# Patient Record
Sex: Male | Born: 1975 | Race: White | Hispanic: No | Marital: Single | Smoking: Former smoker
Health system: Southern US, Academic
[De-identification: ages and names within clinical notes are randomized; demographics above are authoritative.]

## PROBLEM LIST (undated history)

## (undated) DIAGNOSIS — E119 Type 2 diabetes mellitus without complications: Secondary | ICD-10-CM

## (undated) DIAGNOSIS — E782 Mixed hyperlipidemia: Secondary | ICD-10-CM

## (undated) DIAGNOSIS — K7460 Unspecified cirrhosis of liver: Secondary | ICD-10-CM

## (undated) DIAGNOSIS — I10 Essential (primary) hypertension: Secondary | ICD-10-CM

## (undated) HISTORY — DX: Unspecified cirrhosis of liver: K74.60

## (undated) HISTORY — PX: HX HERNIA REPAIR: SHX51

## (undated) HISTORY — DX: Mixed hyperlipidemia: E78.2

## (undated) HISTORY — PX: ABSCESS DRAINAGE: SHX399

## (undated) HISTORY — DX: Type 2 diabetes mellitus without complications: E11.9

## (undated) HISTORY — DX: Essential (primary) hypertension: I10

---

## 2018-10-02 ENCOUNTER — Ambulatory Visit: Payer: Medicaid Other | Attending: Hand Surgery | Admitting: Hand Surgery

## 2018-10-02 ENCOUNTER — Other Ambulatory Visit (INDEPENDENT_AMBULATORY_CARE_PROVIDER_SITE_OTHER): Payer: Self-pay | Admitting: Hand Surgery

## 2018-10-02 ENCOUNTER — Encounter (INDEPENDENT_AMBULATORY_CARE_PROVIDER_SITE_OTHER): Payer: Self-pay | Admitting: Hand Surgery

## 2018-10-02 DIAGNOSIS — Z6841 Body Mass Index (BMI) 40.0 and over, adult: Secondary | ICD-10-CM | POA: Insufficient documentation

## 2018-10-02 DIAGNOSIS — Z87891 Personal history of nicotine dependence: Secondary | ICD-10-CM | POA: Insufficient documentation

## 2018-10-02 DIAGNOSIS — L987 Excessive and redundant skin and subcutaneous tissue: Secondary | ICD-10-CM

## 2018-10-02 NOTE — Progress Notes (Signed)
Tony Dillon  [id number]  09/26/1975  10/02/2018          HISTORY AND PHYSICAL EXAM      REFERRING PROVIDER:  Edwina Barth, MD  [address]  [place], [place] [address]        Chief Complaint   Patient presents with   . Other     consult panniculectomy, excess skin              HISTORY OF PRESENT ILLNESS:      Tony Dillon is a 43 y.o. male who [facility] accompanied by his spouse for evaluation of "removing excess and hanging abdominal fat and skin".  The patient states "he has had abscesses".  The patient is having ongoing problems with  recurrent skin rashes, infections,  cellutitis, chaffing and/or non-healing ulcers with 4 courses of antibiotics and surgical drainage of the abscess in may of this year.  Prescription medications and other conventional strategies have failed to manage these symptoms. The patient admits to the excess weight of the pannus causing problems with walking and their ability to perform day to day activities and household chores. The patient's current BMI is 42.77.  The patient admits that he has had no weight loss.         MEDICAL HISTORY:      No past medical history on file.  Past Medical History was reviewed and is negative for diabetes according to the patient.  HTN is poorly controlled.      Past Surgical History was reviewed and is negative for any procedures other than the I&D of the abscess.        Social History     Socioeconomic History   . Marital status: Single     Spouse name: Not on file   . Number of children: Not on file   . Years of education: Not on file   . Highest education level: Not on file   Occupational History   . Not on file   Social Needs   . Financial resource strain: Not on file   . Food insecurity     Worry: Not on file     Inability: Not on file   . Transportation needs     Medical: Not on file     Non-medical: Not on file   Tobacco Use   . Smoking status: Former Games developer   . Smokeless tobacco: Never Used   Substance and Sexual  Activity   . Alcohol use: Not on file   . Drug use: Not on file   . Sexual activity: Not on file   Lifestyle   . Physical activity     Days per week: Not on file     Minutes per session: Not on file   . Stress: Not on file   Relationships   . Social Wellsite geologist on phone: Not on file     Gets together: Not on file     Attends religious service: Not on file     Active member of club or organization: Not on file     Attends meetings of clubs or organizations: Not on file     Relationship status: Not on file   . Intimate partner violence     Fear of current or ex partner: Not on file     Emotionally abused: Not on file     Physically abused: Not on file  Forced sexual activity: Not on file   Other Topics Concern   . Not on file   Social History Narrative   . Not on file       Social History     Social History Narrative   . Not on file       Social History     Substance and Sexual Activity   Drug Use Not on file       Family Medical History:     None              OB History   No obstetric history on file.         Current Outpatient Medications   Medication Sig   . Ibuprofen (MOTRIN) 200 mg Oral Tablet Take 200 mg by mouth Four times a day as needed for Pain   . lisinopriL (PRINIVIL) 20 mg Oral Tablet Take 20 mg by mouth Once a day   . omeprazole (PRILOSEC) 40 mg Oral Capsule, Delayed Release(E.C.) Take 40 mg by mouth Once a day       Allergies   Allergen Reactions   . Adhesive Rash and Itching     Bandages          REVIEW OF SYSTEMS:    + as per HPI. Denies any headache, dizziness, nasal congestion, sore throat, chest pain, dyspnea, nausea, vomiting, abdomnial pain, diarrhea, constipation, seizures, and ataxia. All other systems were reviewed and are negative.             PHYSICAL EXAMINATION:      Most Recent Vitals      Office Visit from 10/02/2018 in [facility] Phys[facility] Office   Ctr   Temperature  36.5 C (97.7 F) filed at... 10/02/2018 1314   Heart Rate  (!) 114 filed at... 10/02/2018 1314    Respiratory Rate  --   BP (Non-Invasive)  (!) 175/90 filed at... 10/02/2018 1314   SpO2  --   Height  1.943 m (6' 4.5") filed at... 10/02/2018 1314   Weight  (!) 162 kg (356 lb 0.7 oz) filed at... 10/02/2018 1314   BMI (Calculated)  42.86 filed at... 10/02/2018 1314   BSA (Calculated)  2.95 filed at... 10/02/2018 1314        GS: WDWN , NAD, nonseptic   HENT: Normocephalic/atraumatic, sclera anicteric , mucosa pink without oral lesions, throat non erythematous   Neck: Supple, no thyromegaly, nl ROM   Pulmonary: CTA bilaterally, no w/r/r  Cardiac: RRR, no m/r/g  Lymph: no axillary, cervical or supraclavicular adenopathy bilaterally   Abdomen:  Non-tender. No organomegaly. Excess abdominal skin present with an apron.   Neuro: A&Ox3, CN II-XII grossly intact   Skin: Non-diaphoretic.   Psychiatric: Normal mood, affect and behavior.             IMAGING DATA:    NONE.         LABORATORY DATA:    NONE.      PATHOLOGY:  NONE.           ASSESSMENT:    Morbid obesity    PLAN:    1. The patient is not an appropriate candidate for a panniculectomy at this time  2. We did explain the surgical procedure and incision lines that are used and scar afterwards. We discussed the benefits, alternatives and risks with the patient at length.   Risks discussed included but were not limited to:  general anesthesia, sudden death, MI, CVA,  infection, bleeding,  scarring, hematoma, seroma, pain/soreness, abdominal asymmetry, skin necrosis, wound dehiscense, delayed wound healing, venous thrombosis, injury to surrounding organs/structures, and need for additional procedures.  3. Referred to bariatric surgery.   4. Discussed that it's best to perform the surgery once patient is at ideal body weight to also maximize aesthetic results and have best chance for success. Once he is at ideal body weight and has maintained it for 3 months, patient should call for a return appointment to discuss having the surgery.      Approximately 25 of 45 minutes  (more than 50% of the visit) was spent interviewing, examining and counseling the patient about the plan and recommendations.  The other 20 minutes was spent reviewing the medical records.    Meredeth IdeJack Marietta Sikkema, MD  10/02/2018, 13:43   Department of Reconstructive, Plastic and Hand Surgery

## 2020-03-20 IMAGING — MR MRI LUMBAR SPINE WITHOUT CONTRAST
7 series · 48 of 48 positions shown · IV contrast (gadolinium)
Comparison: None available.

﻿EXAM:  [id number]   MRI LUMBAR SPINE WITHOUT CONTRAST
INDICATION: Lumbar disc degeneration.
TECHNIQUE: Multiplanar multisequential MRI of the lumbar spine was performed without gadolinium contrast.

[Series 4: T2 · sagittal · 5.0mm · 1.00mm/px · 5 of 15 slices shown (1 of 4)]
[im 1/15]
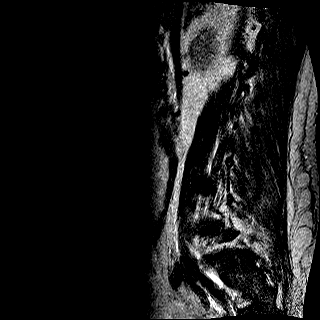
[im 4/15]
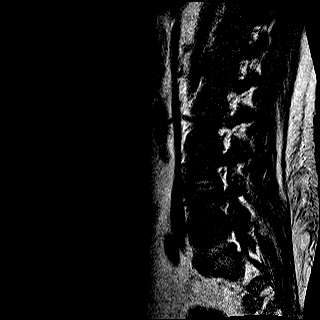
[im 8/15]
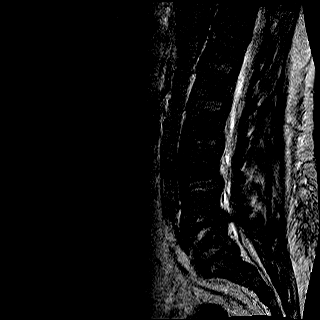
[im 11/15]
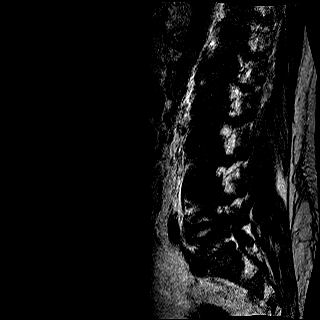
[im 15/15]
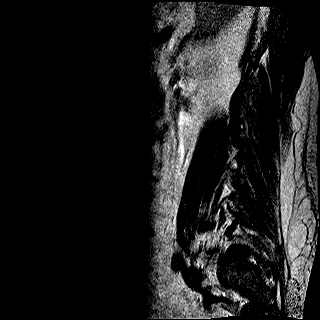

[Series 5: T1 · sagittal · 5.0mm · 1.00mm/px · 6 of 15 slices shown (1 of 2)]
[im 1/15]
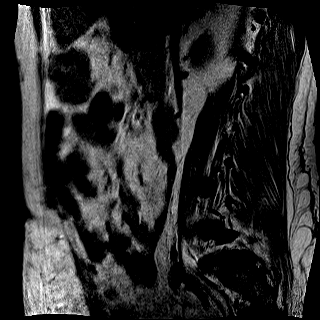
[im 3/15]
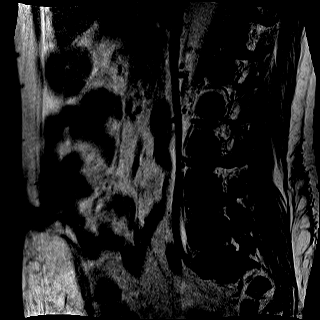
[im 6/15]
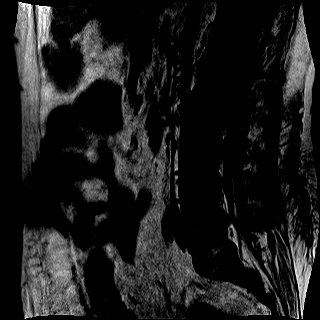
[im 9/15]
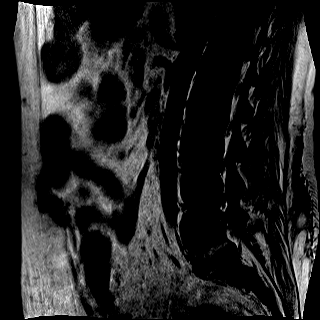
[im 12/15]
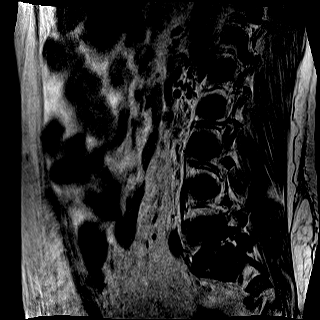
[im 15/15]
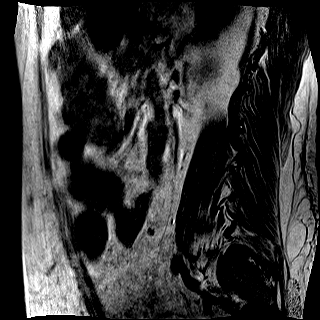

[Series 6: STIR · sagittal · 5.0mm · 1.25mm/px · 6 of 15 slices shown]
[im 1/15]
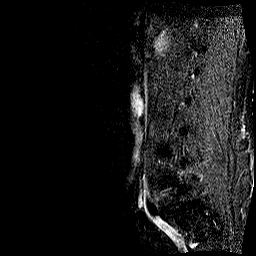
[im 3/15]
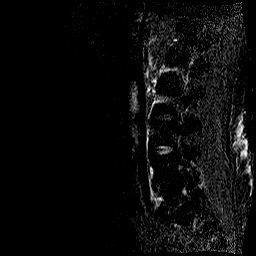
[im 6/15]
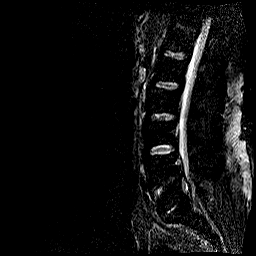
[im 9/15]
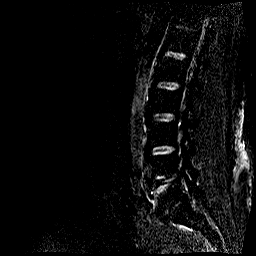
[im 12/15]
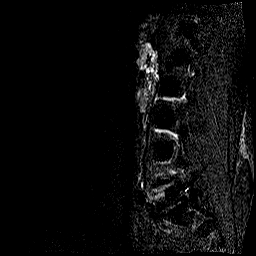
[im 15/15]
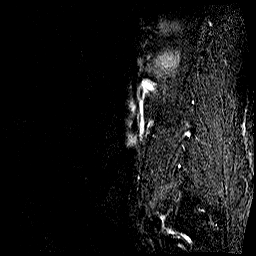

[Series 7: T2 · axial · 5.0mm · 0.89mm/px · z∈[-135,+76]mm · 9 of 25 slices shown (2 of 4)]
[im 1/25]
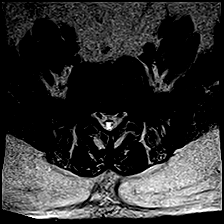
[im 4/25]
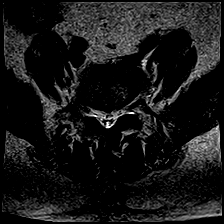
[im 7/25]
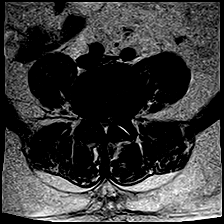
[im 10/25]
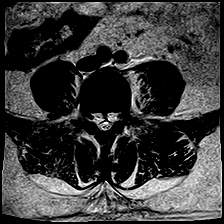
[im 13/25]
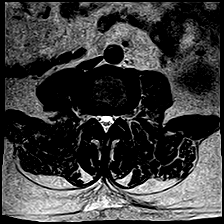
[im 16/25]
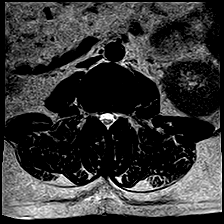
[im 19/25]
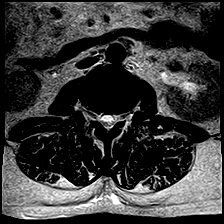
[im 22/25]
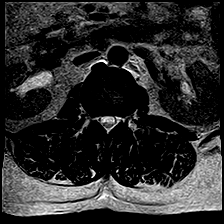
[im 25/25]
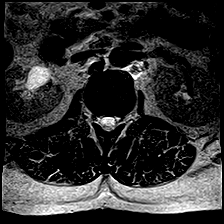

[Series 8: T1 · axial · 5.0mm · 0.89mm/px · z∈[-135,+76]mm · 9 of 25 slices shown (2 of 2)]
[im 1/25]
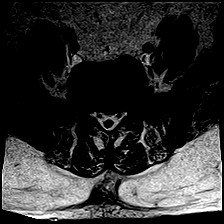
[im 4/25]
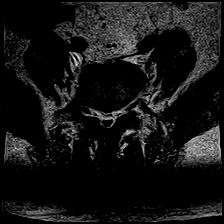
[im 7/25]
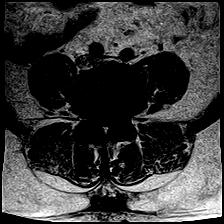
[im 10/25]
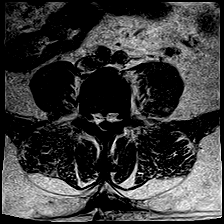
[im 13/25]
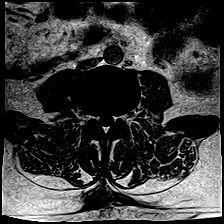
[im 16/25]
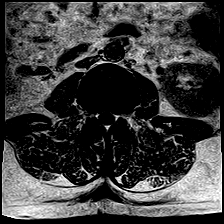
[im 19/25]
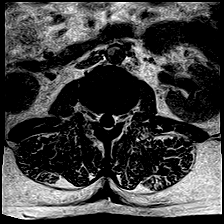
[im 22/25]
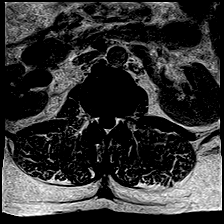
[im 25/25]
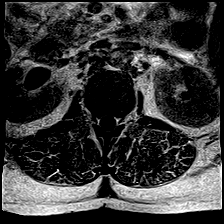

[Series 9: T2 · coronal · 4.0mm · 1.34mm/px · 7 of 20 slices shown (3 of 4)]
[im 1/20]
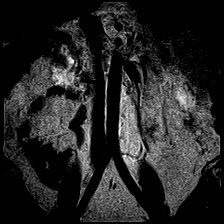
[im 4/20]
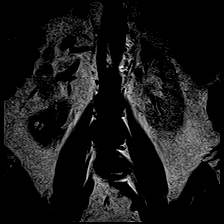
[im 7/20]
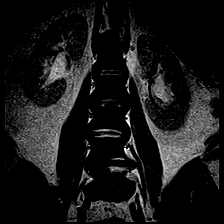
[im 10/20]
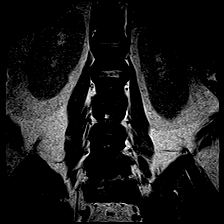
[im 13/20]
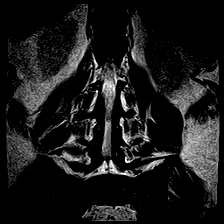
[im 16/20]
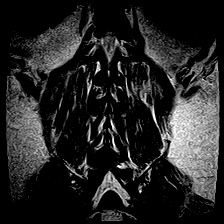
[im 20/20]
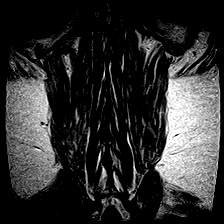

[Series 10: T2 · sagittal · 5.0mm · 1.00mm/px · 6 of 15 slices shown (4 of 4)]
[im 1/15]
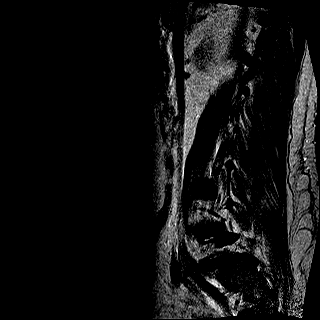
[im 3/15]
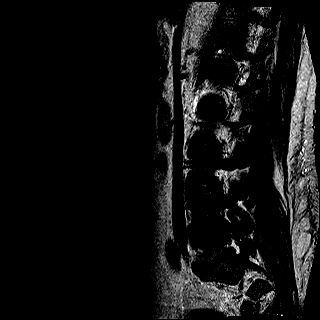
[im 6/15]
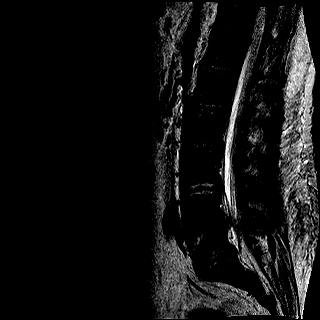
[im 9/15]
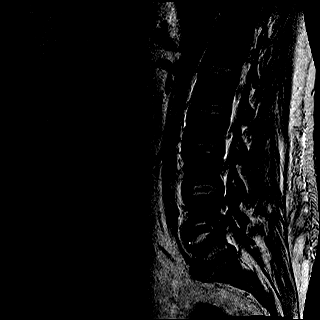
[im 12/15]
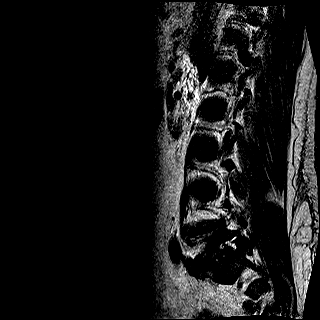
[im 15/15]
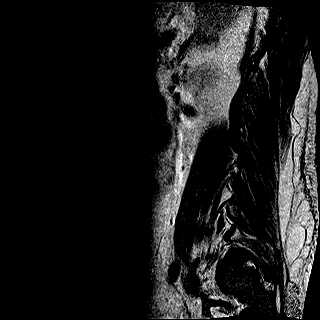

[48 of 48 positions shown; findings below may reference images not displayed]

FINDINGS: This examination is limited by the patient’s large size which required use of the body coil, and therefore imaging quality is suboptimal.  

At L4-5 there is a moderate sized disc herniation that is contributing to severe spinal stenosis at this level.  

There are moderate degenerative changes at L4-5 and L5-S1.  

The remaining visualized levels appear unremarkable.  There is no fracture, malalignment, or abnormality of the conus.  Degenerative marrow signal alteration is noted at L4-5.
IMPRESSION: 1. L4-5 disc herniation with severe spinal stenosis.  

:

## 2020-05-13 IMAGING — MR MRI CERVICAL SPINE WITHOUT CONTRAST
4 of 5 series · 23 of 48 positions shown · IV contrast (gadolinium)
Comparison: None available.

﻿EXAM:  [id number]   MRI CERVICAL SPINE WITHOUT CONTRAST
INDICATION: Neck pain with bilateral upper extremity radiculopathy.
TECHNIQUE: Multiplanar multisequential MRI of the cervical spine was performed without gadolinium contrast.

[Series 5: T2 · sagittal · 3.0mm · 0.75mm/px · 8 of 15 slices shown (1 of 2)]
[im 1/15]
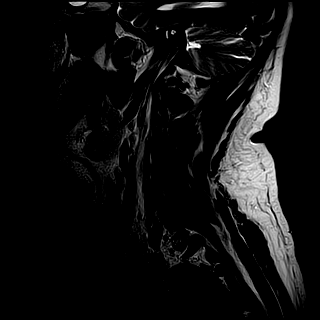
[im 3/15]
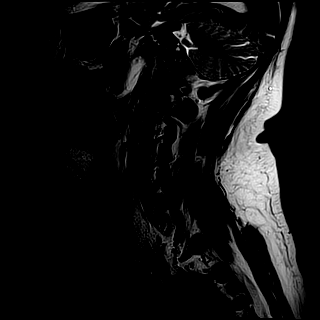
[im 5/15]
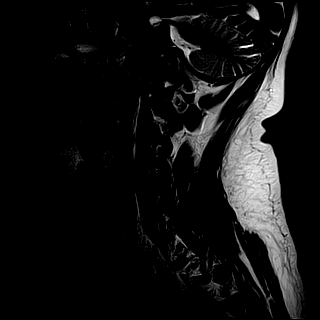
[im 7/15]
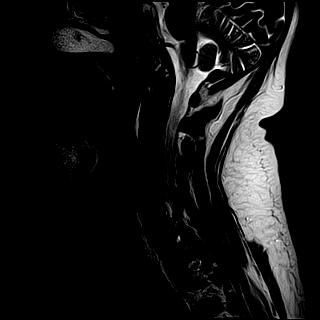
[im 9/15]
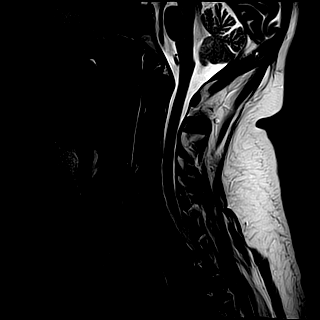
[im 11/15]
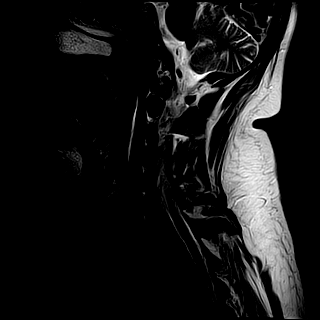
[im 13/15]
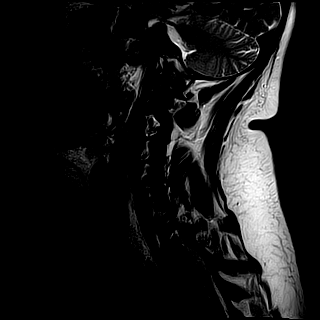
[im 15/15]
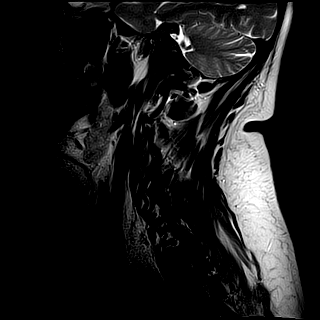

[Series 6: T1 · sagittal · 3.0mm · 0.47mm/px · 3 of 15 slices shown]
[im 2/15]
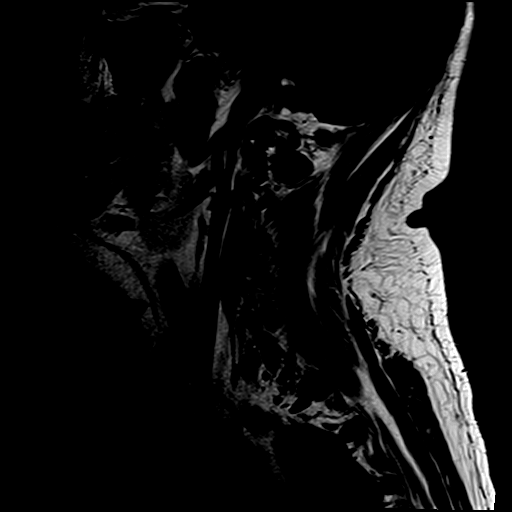
[im 8/15]
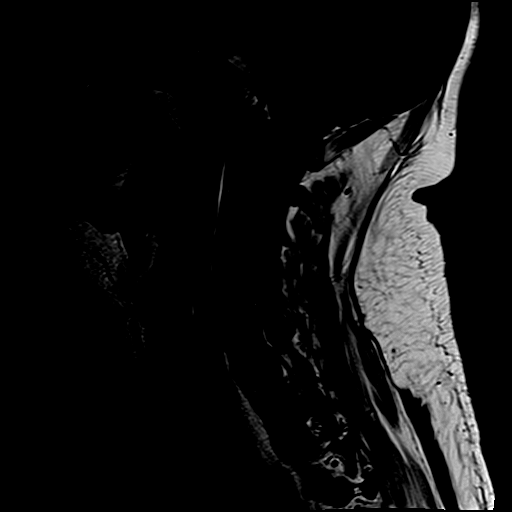
[im 13/15]
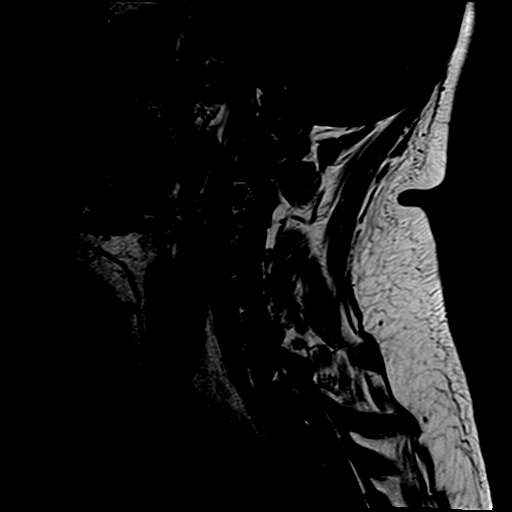

[Series 7: STIR · sagittal · 3.0mm · 0.47mm/px · 3 of 15 slices shown]
[im 2/15]
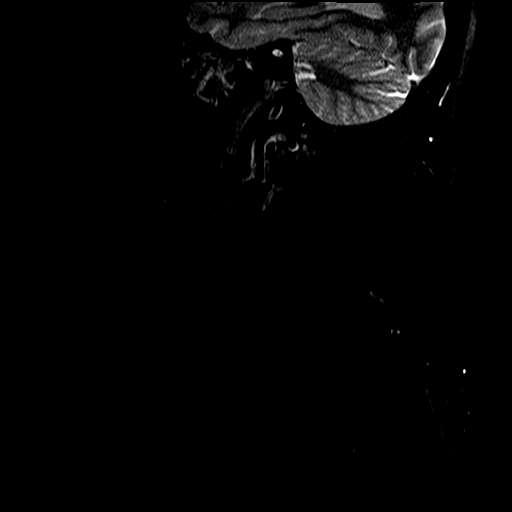
[im 8/15]
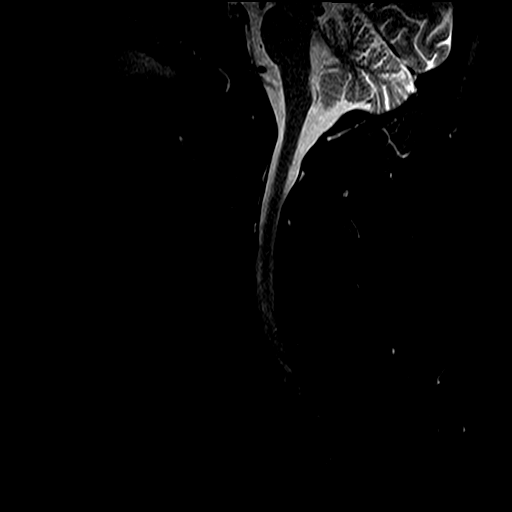
[im 13/15]
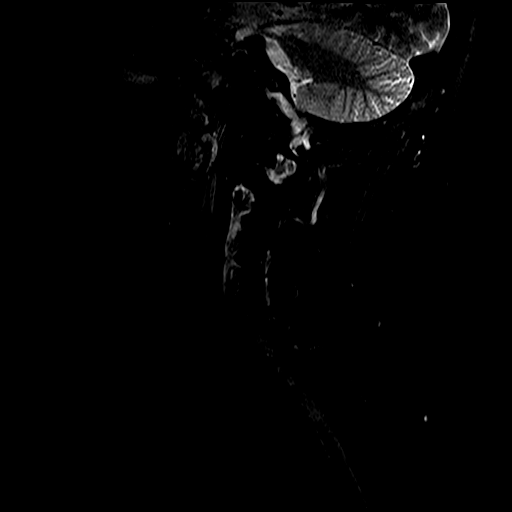

[Series 8: T2 · axial · 3.0mm · 0.39mm/px · z∈[-106,+4]mm · 9 of 18 slices shown (2 of 2)]
[im 1/18]
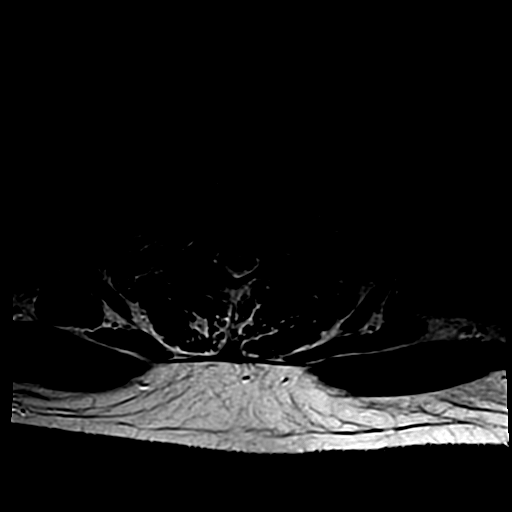
[im 2/18]
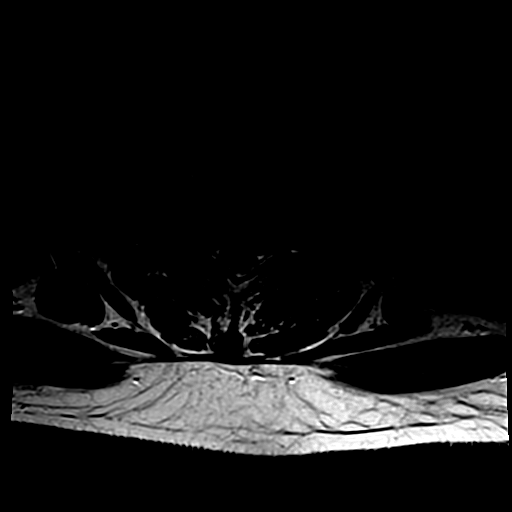
[im 4/18]
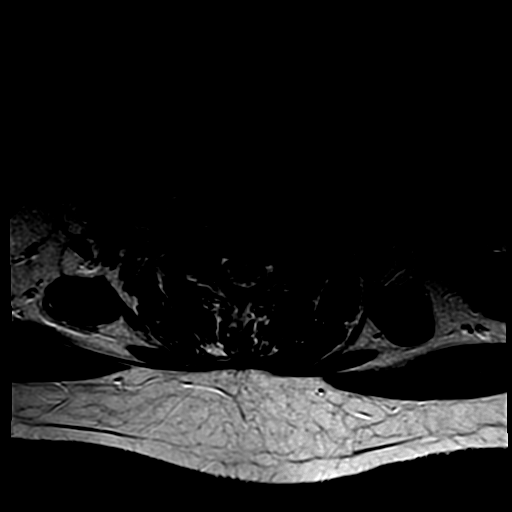
[im 6/18]
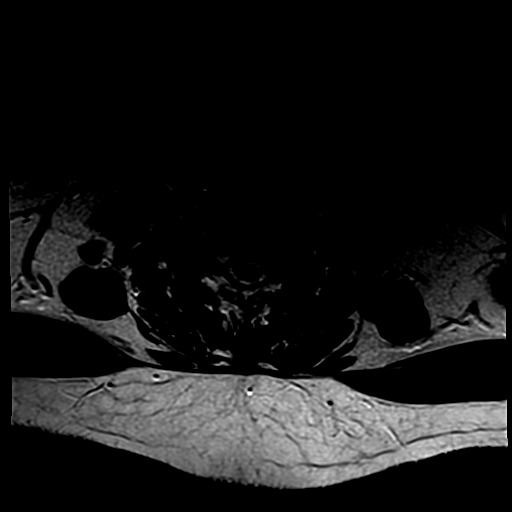
[im 7/18]
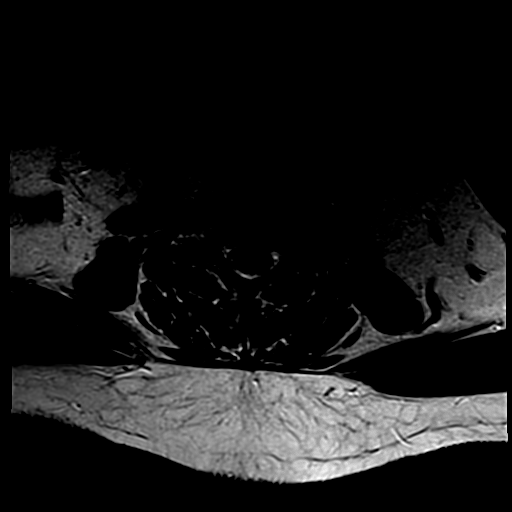
[im 9/18]
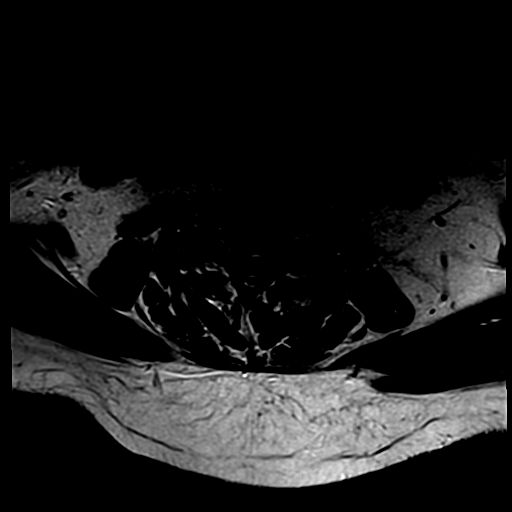
[im 11/18]
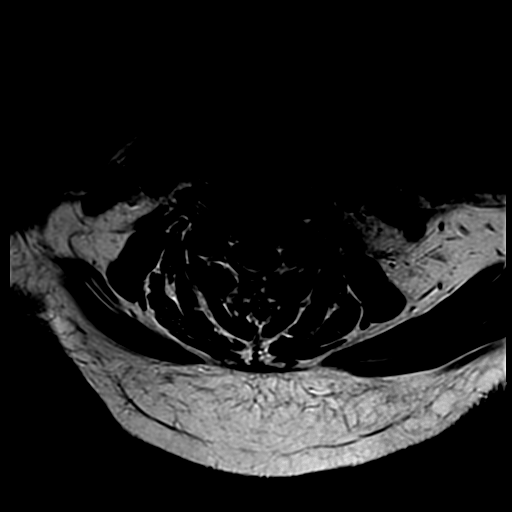
[im 12/18]
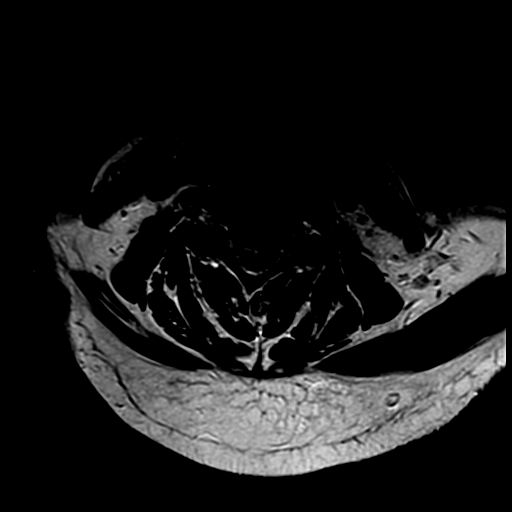
[im 16/18]
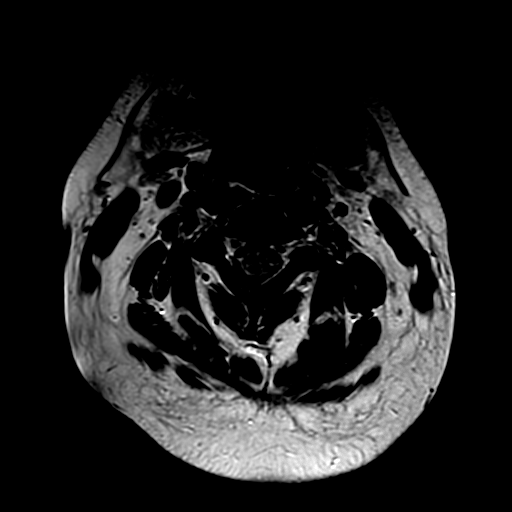

[23 of 48 positions shown; findings below may reference images not displayed]

FINDINGS: There is heterogeneous low T1 signal intensity within the visualized cervical and thoracic vertebral bodies. No acute fracture or subluxation is seen. Visualized spinal cord is normal in signal intensity without evidence of compression at any level.

C2-3 level is unremarkable.

At C3-4 level, there is a minimal bulging annulus, minimally effacing the ventral CSF. There is no significant neural foraminal stenosis.

At C4-5 level, there is mild left neural foraminal stenosis from uncovertebral joint hypertrophy.

At C5-6 level, there is moderate to severe left neural foraminal stenosis from facet and uncovertebral joint hypertrophy.

C6-7, C7-T1 level and paraspinal soft tissues are unremarkable.
IMPRESSION: 1. Heterogeneous low T1 signal intensity within the visualized cervical and thoracic vertebral bodies. Similar findings can be seen in patients with chronic anemia resulting in fatty marrow reconversion to red marrow. Correlation with patient's hematocrit level is recommended. 

2. No significant disc herniation or spinal stenosis at any level. 

3. Multilevel neural foraminal stenosis as detailed above.

## 2021-03-14 IMAGING — MR MRI LUMBAR SPINE WITHOUT CONTRAST
6 series · 48 of 48 positions shown · non-contrast
Comparison: MRI lumbosacral spine dated 03/20/2020.  MRI cervical spine dated 05/13/2020.

﻿EXAM:  [id number]   MRI LUMBAR SPINE WITHOUT CONTRAST
INDICATION: 46-year-old with persistent low back pain.  No history of malignancy or back surgery.  Patient reports history of liver problem and anemia.
TECHNIQUE: Coronal, sagittal and axial images of lumbar spine including T1, T2 and STIR sequences.

[Series 4: T2 · sagittal · 5.0mm · 1.00mm/px · 5 of 13 slices shown (1 of 3)]
[im 1/13]
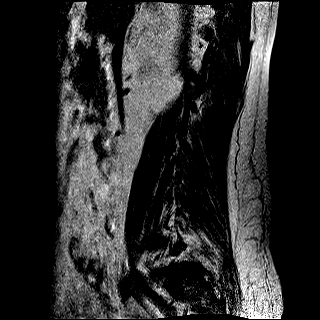
[im 4/13]
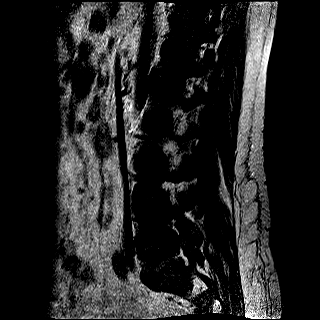
[im 7/13]
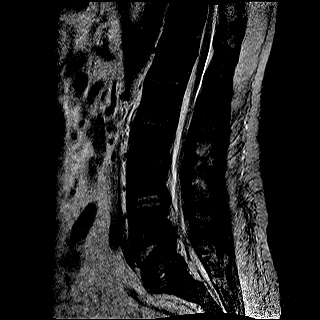
[im 10/13]
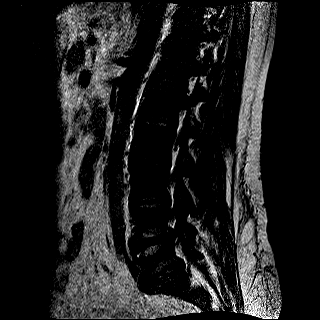
[im 13/13]
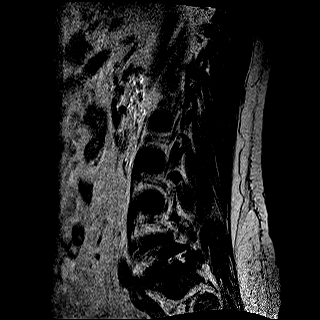

[Series 5: T1 · sagittal · 5.0mm · 1.00mm/px · 6 of 13 slices shown (1 of 2)]
[im 1/13]
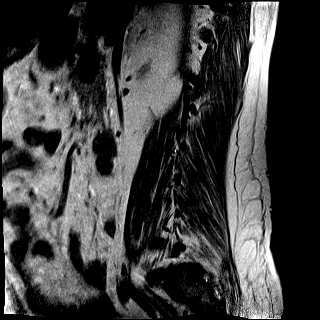
[im 3/13]
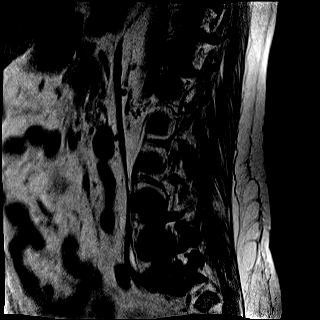
[im 5/13]
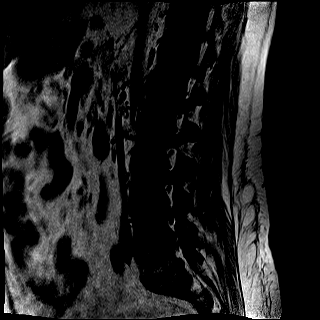
[im 8/13]
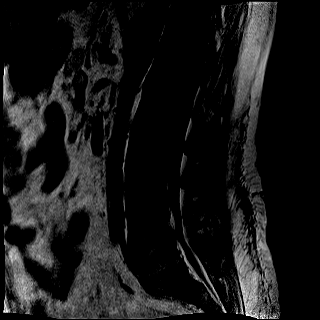
[im 10/13]
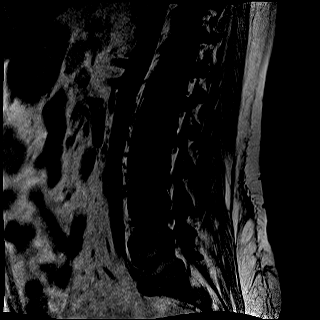
[im 13/13]
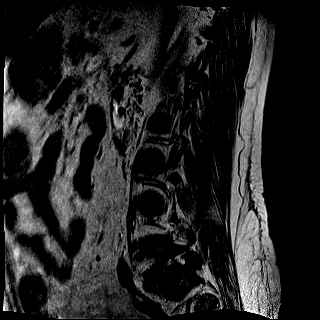

[Series 6: STIR · sagittal · 5.0mm · 1.25mm/px · 6 of 13 slices shown]
[im 1/13]
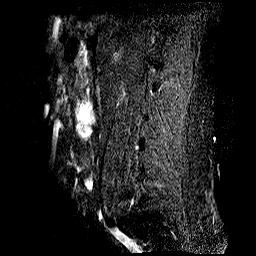
[im 3/13]
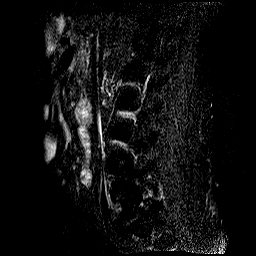
[im 5/13]
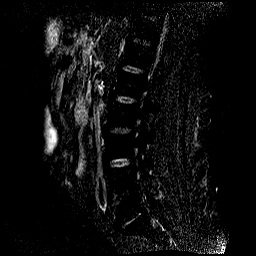
[im 8/13]
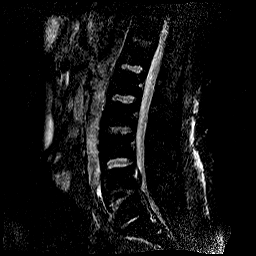
[im 10/13]
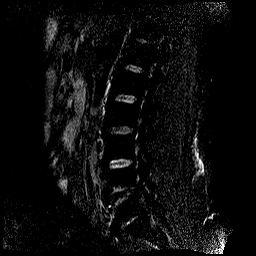
[im 13/13]
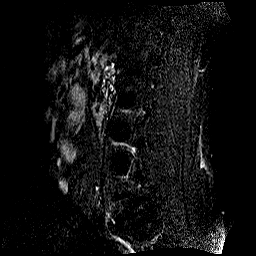

[Series 7: T2 · axial · 5.0mm · 0.89mm/px · z∈[-172,+57]mm · 11 of 24 slices shown (2 of 3)]
[im 1/24]
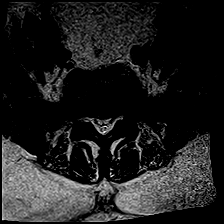
[im 3/24]
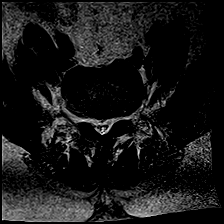
[im 5/24]
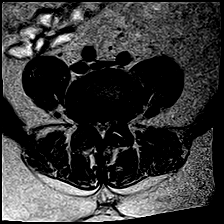
[im 7/24]
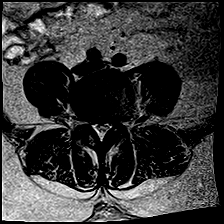
[im 10/24]
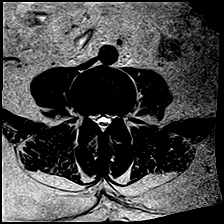
[im 12/24]
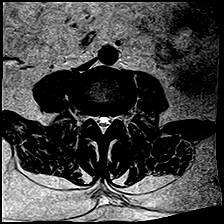
[im 14/24]
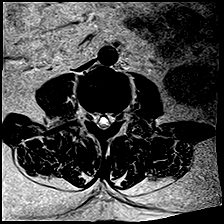
[im 17/24]
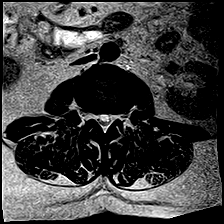
[im 19/24]
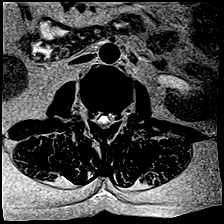
[im 21/24]
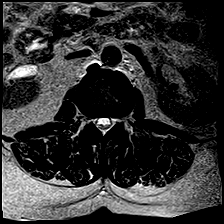
[im 24/24]
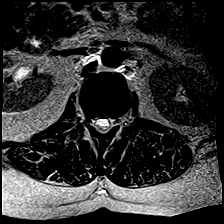

[Series 8: T1 · axial · 5.0mm · 0.89mm/px · z∈[-172,+57]mm · 11 of 24 slices shown (2 of 2)]
[im 1/24]
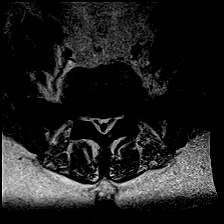
[im 3/24]
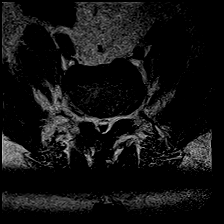
[im 5/24]
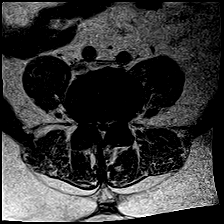
[im 7/24]
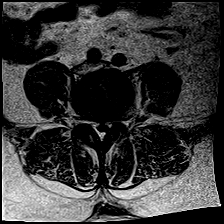
[im 10/24]
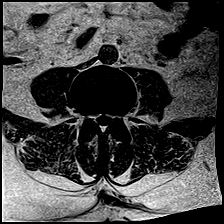
[im 12/24]
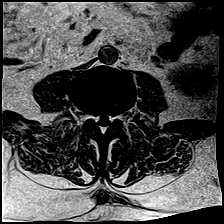
[im 14/24]
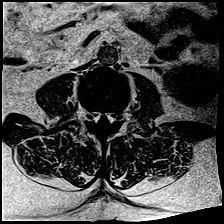
[im 17/24]
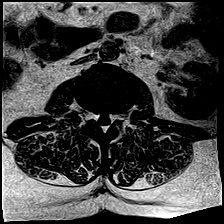
[im 19/24]
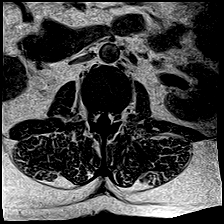
[im 21/24]
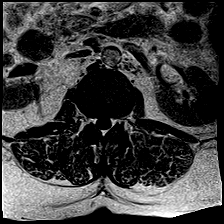
[im 24/24]
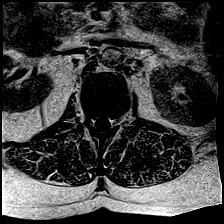

[Series 9: T2 · coronal · 4.0mm · 1.34mm/px · 9 of 20 slices shown (3 of 3)]
[im 1/20]
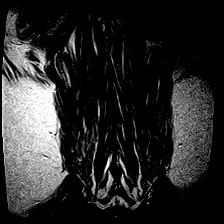
[im 3/20]
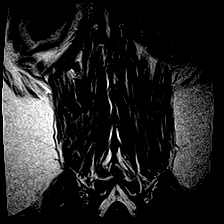
[im 5/20]
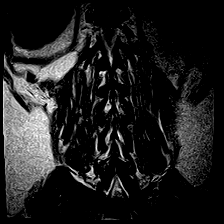
[im 8/20]
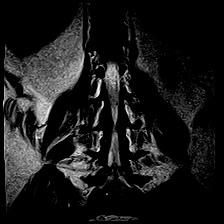
[im 10/20]
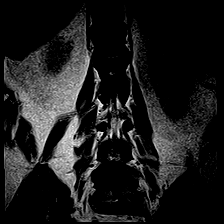
[im 12/20]
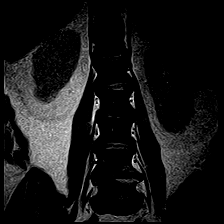
[im 15/20]
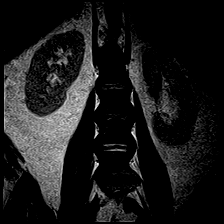
[im 17/20]
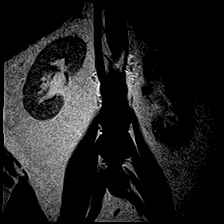
[im 20/20]
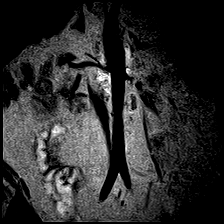

[48 of 48 positions shown; findings below may reference images not displayed]

FINDINGS: No acute bony lesions are seen.  However, significantly decreased T2 signal of bone marrow throughout the lumbar spine is noted similar in appearance to MRI examination of 03/20/2020 as well as a MRI cervical spine findings on 05/13/2020.  

Lower spinal cord and cauda equina are normal.  At L1-L2 level, no focal disc pathology.  Mild bilateral facet arthropathy is noted causing minimal compromise of lateral recesses at L1-L2 level. 

At L2-L3 level, mild bilateral facet arthropathy causing minimal compromise of both lateral recesses similar to prior study.  

At L3-L4 level, bulging annulus and facet arthropathy are causing minimal compromise of lateral recesses similar to prior study.  

At L4-L5 level, severe degenerative disc disease is noted with bulging annulus, asymmetrically to the left and severe facet arthropathy, left worse than the right, causing severe compromise of left lateral recess and left neural foramen.  Compromise of thecal sac is noted with AP diameter in the midline measuring 5.7 mm.  Findings at L4-L5 level are essentially unchanged from previous examination of 03/20/2020.  

At L5-S1 level, mild bilateral foraminal narrowing due to degenerative disc disease and facet arthropathy.  

Paravertebral soft tissues show evidence of hepatomegaly and splenomegaly with craniocaudal span of liver measuring 16.5 cm and spleen 17.5 cm.
IMPRESSION: 1. Significantly and diffusely diminished T2 and T1 bone marrow signal throughout the lumbar spine similar to prior imaging studies of cervical spine and lumbar spine dated 05/13/2020 as well as 03/20/2020.  These findings along with findings of borderline hepatomegaly and significant splenomegaly are suggestive of bone marrow changes secondary to significant chronic anemia and possible hemochromatosis.  

2. At L4-L5 level, severe degenerative disc disease is noted with bulging annulus, asymmetrically to the left and severe facet arthropathy, left worse than the right, causing severe compromise of left lateral recess and left neural foramen.  Compromise of thecal sac is noted with AP diameter in the midline measuring 5.7 mm.  Findings at L4-L5 level are essentially unchanged from previous examination of 03/20/2020.  

3. Findings at other levels are described above in detail.  Overall findings of lumbar discs are unchanged from prior study of 03/20/2020.

## 2021-04-17 ENCOUNTER — Ambulatory Visit (HOSPITAL_COMMUNITY): Payer: Self-pay | Admitting: INTERNAL MEDICINE-ENDOCRINOLOGY-DIABETES AND METABOLISM

## 2021-11-24 IMAGING — MR MRI BRAIN WITHOUT AND WITH CONTRAST
10 of 12 series · 37 of 48 positions shown · IV contrast (gadavist)
Comparison: No prior imaging studies of the brain are available for comparison.

﻿EXAM:  MRI BRAIN WITHOUT AND WITH CONTRAST, ATTENTION PITUITARY GLAND:
INDICATION: 46-year-old male with hyperprolactinemia.  History of hypertension.
TECHNIQUE: Multiplanar, multisequential MRI of the brain was performed without and with 10 mL of Gadavist.  Thin section high-resolution images of the pituitary gland were performed pre and postcontrast in coronal and sagittal dimensions.

[Series 5: DWI · axial · 5.0mm · 1.35mm/px · z∈[-79,+59]mm · 9 of 96 slices shown (1 of 3)]
[im 1/96]
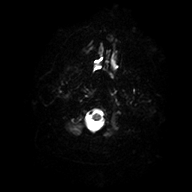
[im 16/96]
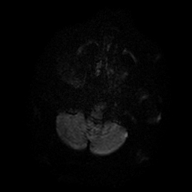
[im 32/96]
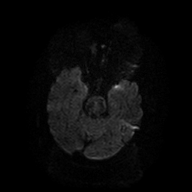
[im 40/96]
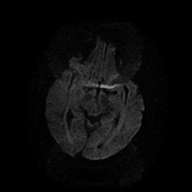
[im 48/96]
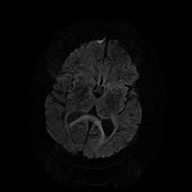
[im 56/96]
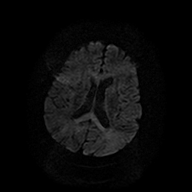
[im 64/96]
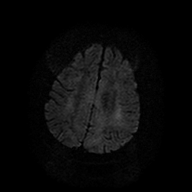
[im 80/96]
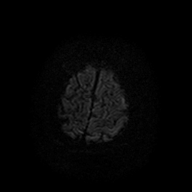
[im 96/96]
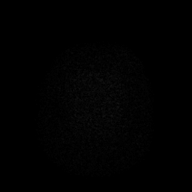

[Series 6: DWI · axial · 5.0mm · 1.35mm/px · z∈[-79,+59]mm · 4 of 24 slices shown (2 of 3)]
[im 1/24]
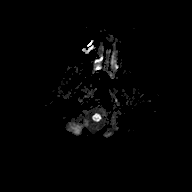
[im 8/24]
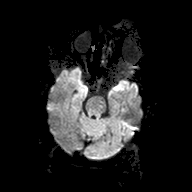
[im 16/24]
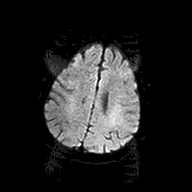
[im 24/24]
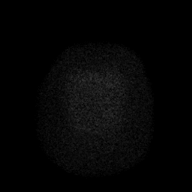

[Series 7: DWI · axial · 5.0mm · 1.35mm/px · z∈[-79,+59]mm · 3 of 23 slices shown (3 of 3)]
[im 1/23]
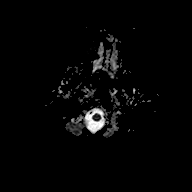
[im 12/23]
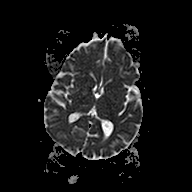
[im 23/23]
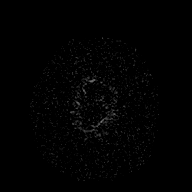

[Series 8: FLAIR · sagittal · 4.0mm · 0.75mm/px · 4 of 30 slices shown (1 of 2)]
[im 1/30]
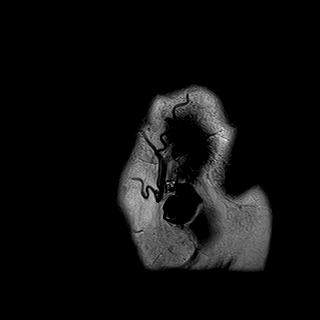
[im 10/30]
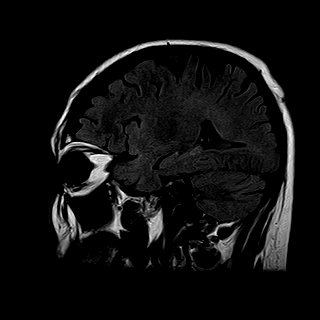
[im 20/30]
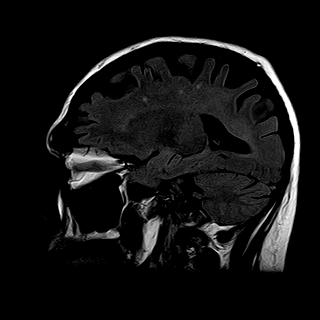
[im 30/30]
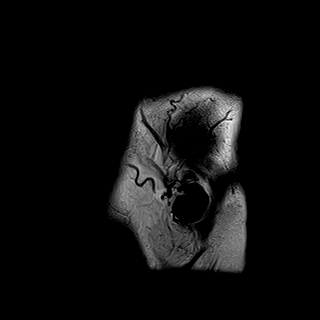

[Series 9: T2 · axial · 5.0mm · 0.43mm/px · z∈[-83,+60]mm · 4 of 25 slices shown]
[im 1/25]
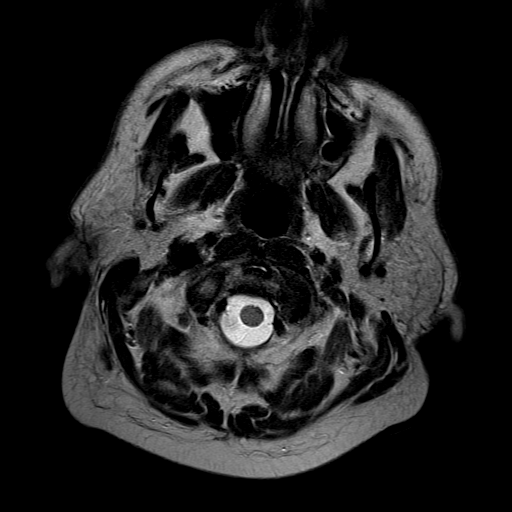
[im 9/25]
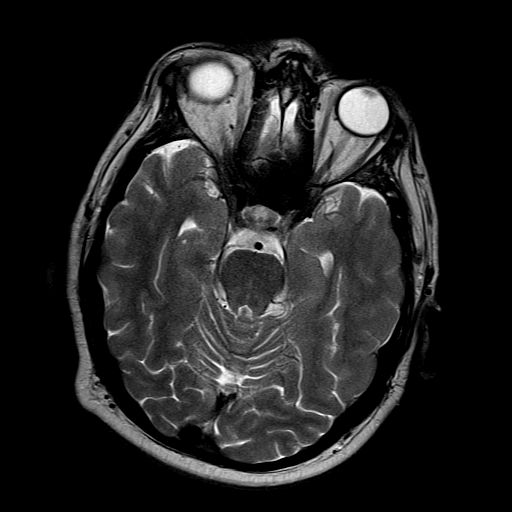
[im 17/25]
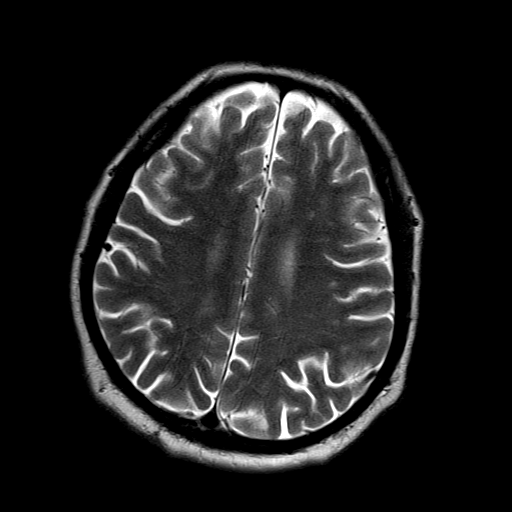
[im 25/25]
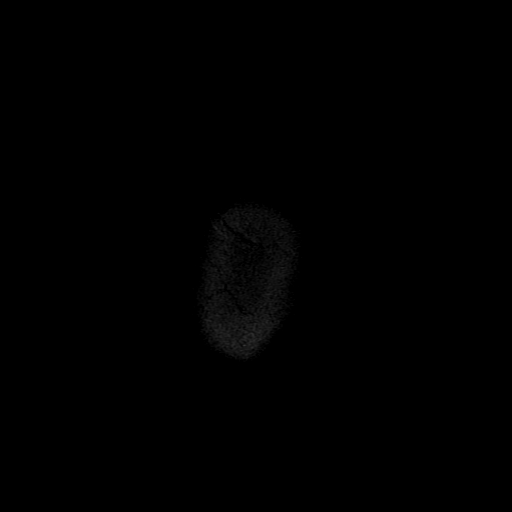

[Series 10: FLAIR · axial · 5.0mm · 0.43mm/px · z∈[-83,+60]mm · 4 of 25 slices shown (2 of 2)]
[im 1/25]
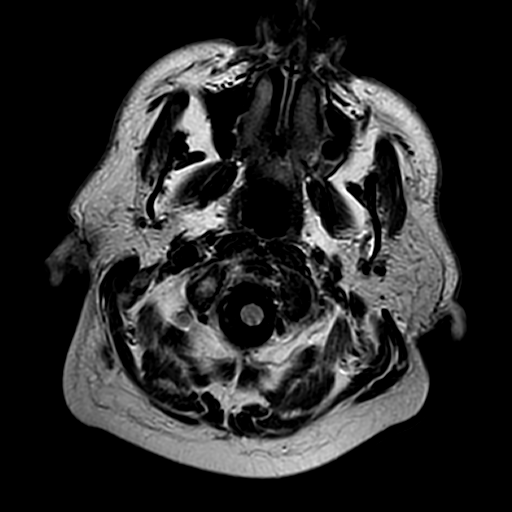
[im 9/25]
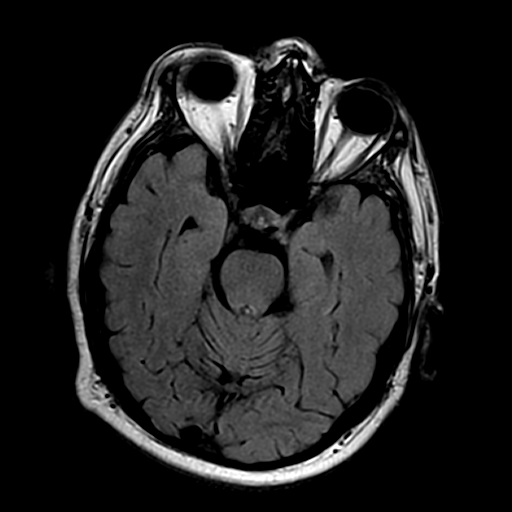
[im 17/25]
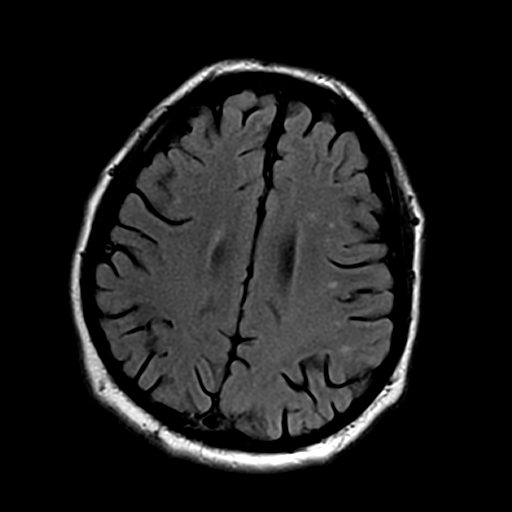
[im 25/25]
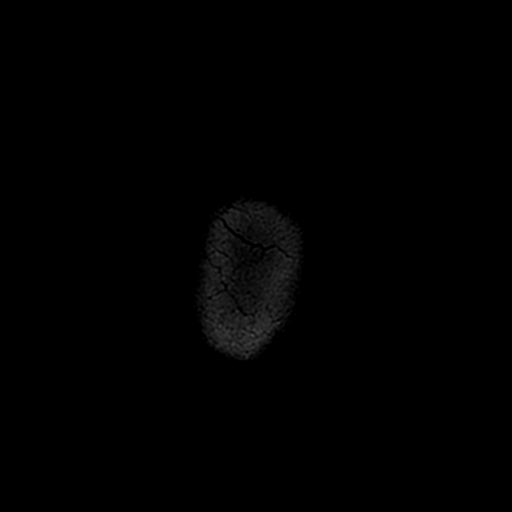

[Series 11: T1 · axial · 5.0mm · 0.43mm/px · 1 of 25 slices shown]
[im 1/25]
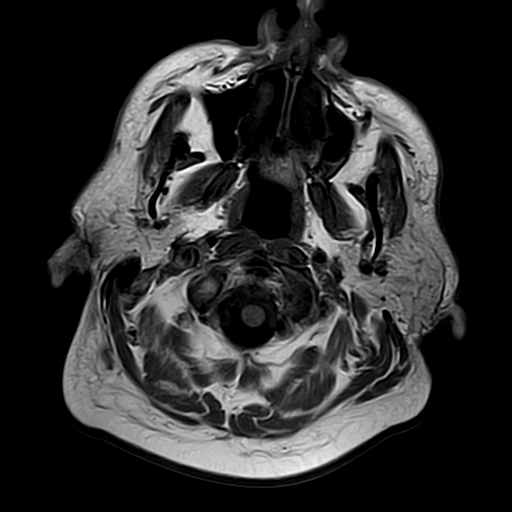

[Series 15: T1 post-contrast · coronal · 3.0mm · 0.56mm/px · 2 of 13 slices shown (1 of 3)]
[im 1/13]
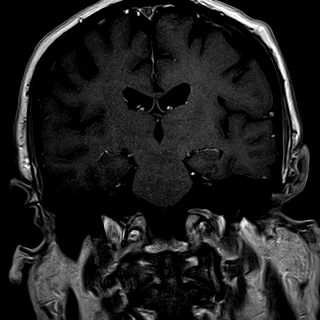
[im 13/13]
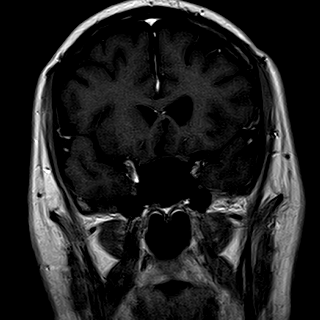

[Series 16: T1 post-contrast · sagittal · 3.0mm · 0.56mm/px · 2 of 13 slices shown (2 of 3)]
[im 1/13]
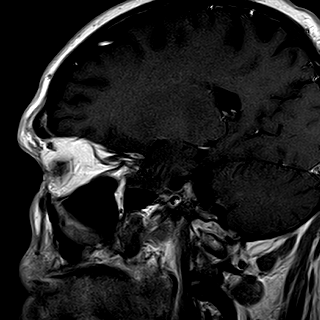
[im 13/13]
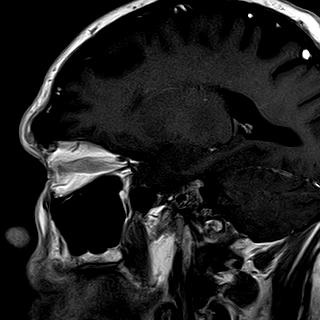

[Series 17: T1 post-contrast · axial · 5.0mm · 0.43mm/px · z∈[-83,+60]mm · 4 of 25 slices shown (3 of 3)]
[im 1/25]
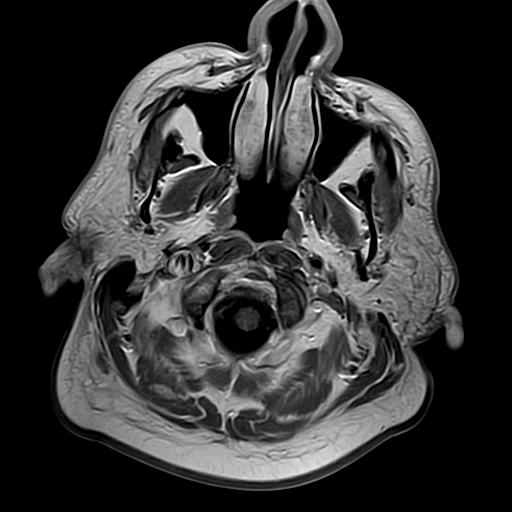
[im 9/25]
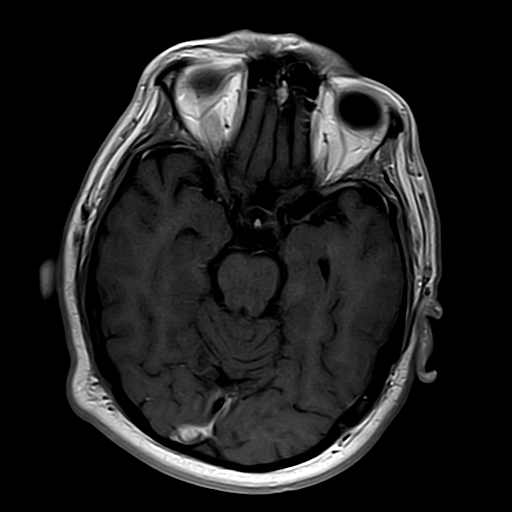
[im 17/25]
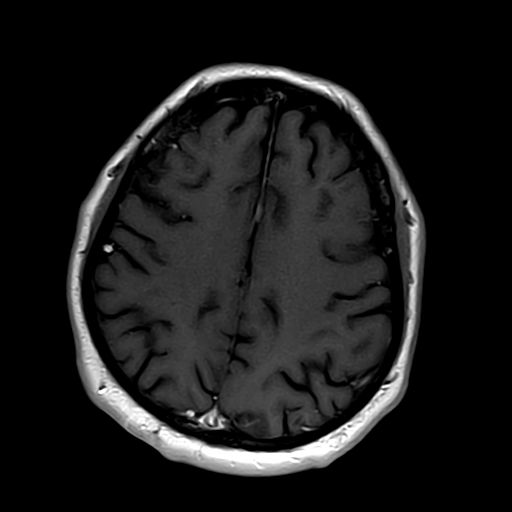
[im 25/25]
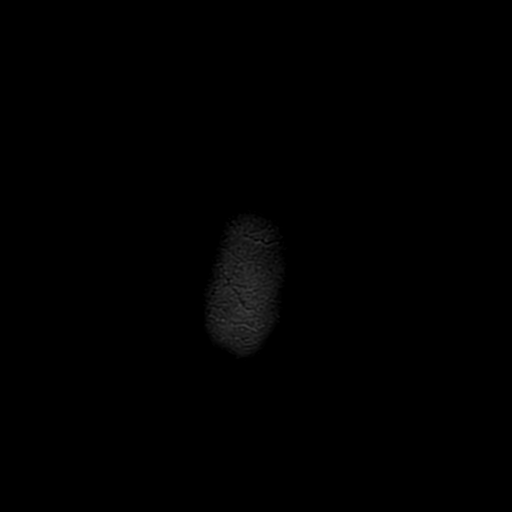

[37 of 48 positions shown; findings below may reference images not displayed]

FINDINGS: No acute ischemic change on the diffusion sequence.

No evidence of intracranial bleed or extra-axial collections are seen.  No ventriculomegaly or midline shift.  A few subcortical FLAIR bright lesions of the left frontoparietal region suggest changes secondary to chronic small-vessel ischemia.

No abnormal enhancement on postcontrast study.  Major arteries of circle of Willis and dural venous sinuses are patent.

Normal sized pituitary gland measuring 11 x 9 x 11 mm in size.  No evidence of microadenoma or macroadenoma are seen.  Pituitary infundibulum is in the midline.
IMPRESSION: 1.  Normal size pituitary gland.  No evidence of microadenoma or macroadenoma of the pituitary is seen.

2. No intracranial space-occupying lesions. No evidence of ventriculomegaly or midline shift. 

3. Mild changes of chronic small-vessel ischemia of subcortical white matter in the left frontoparietal region.  Major arteries of circle of Willis and dural venous sinuses are patent.  

Electronically Signed by BELGRAVE, DEJAN DRAGANA at [id number] [DATE]

## 2022-05-20 ENCOUNTER — Ambulatory Visit (HOSPITAL_BASED_OUTPATIENT_CLINIC_OR_DEPARTMENT_OTHER): Payer: Self-pay | Admitting: Internal Medicine

## 2022-07-14 ENCOUNTER — Encounter (HOSPITAL_BASED_OUTPATIENT_CLINIC_OR_DEPARTMENT_OTHER): Payer: Self-pay | Admitting: Internal Medicine

## 2022-07-20 ENCOUNTER — Encounter (HOSPITAL_BASED_OUTPATIENT_CLINIC_OR_DEPARTMENT_OTHER): Payer: Self-pay | Admitting: Internal Medicine

## 2022-07-20 DIAGNOSIS — E291 Testicular hypofunction: Secondary | ICD-10-CM | POA: Insufficient documentation

## 2022-07-20 DIAGNOSIS — E221 Hyperprolactinemia: Secondary | ICD-10-CM | POA: Insufficient documentation

## 2022-07-20 DIAGNOSIS — E063 Autoimmune thyroiditis: Secondary | ICD-10-CM | POA: Insufficient documentation

## 2022-07-21 ENCOUNTER — Other Ambulatory Visit: Payer: Self-pay

## 2022-07-21 ENCOUNTER — Ambulatory Visit: Payer: MEDICAID | Attending: Internal Medicine | Admitting: Internal Medicine

## 2022-07-21 ENCOUNTER — Other Ambulatory Visit (HOSPITAL_BASED_OUTPATIENT_CLINIC_OR_DEPARTMENT_OTHER): Payer: Self-pay | Admitting: Internal Medicine

## 2022-07-21 ENCOUNTER — Encounter (HOSPITAL_BASED_OUTPATIENT_CLINIC_OR_DEPARTMENT_OTHER): Payer: Self-pay | Admitting: Internal Medicine

## 2022-07-21 VITALS — BP 137/75 | HR 49 | Temp 97.7°F | Ht 76.0 in | Wt 288.0 lb

## 2022-07-21 DIAGNOSIS — E063 Autoimmune thyroiditis: Secondary | ICD-10-CM | POA: Insufficient documentation

## 2022-07-21 DIAGNOSIS — E291 Testicular hypofunction: Secondary | ICD-10-CM | POA: Insufficient documentation

## 2022-07-21 DIAGNOSIS — E221 Hyperprolactinemia: Secondary | ICD-10-CM | POA: Insufficient documentation

## 2022-07-21 MED ORDER — LEVOTHYROXINE 125 MCG TABLET
125.0000 ug | ORAL_TABLET | Freq: Every morning | ORAL | 5 refills | Status: DC
Start: 2022-07-21 — End: 2022-11-17

## 2022-07-21 MED ORDER — SILDENAFIL 100 MG TABLET
100.0000 mg | ORAL_TABLET | ORAL | 5 refills | Status: DC | PRN
Start: 2022-07-21 — End: 2022-11-17

## 2022-07-21 MED ORDER — LEVOTHYROXINE 150 MCG TABLET
150.0000 ug | ORAL_TABLET | Freq: Every morning | ORAL | 5 refills | Status: DC
Start: 2022-07-21 — End: 2022-07-21

## 2022-07-21 NOTE — Patient Instructions (Signed)
patient instructed to take thyroid medication on empty stomach wait 1hr before eating  and avoid taking vitamins or iron for at least 3 hrs before and after thyroid meds

## 2022-07-21 NOTE — Progress Notes (Signed)
[facility]  [address]  [place] [address]  Operated by [facility]  Progress Note    Name: Tony Dillon MRN:  [id number]   Date: 07/21/2022 DOB:  09-20-1975 (47 y.o.)              ENDOCRINOLOGY OUTPATIENT PROGRESS NOTE    Name: Tony Dillon  Age: 47 y.o., Sex: Male  MRN: [id number]    Referred by: Gerrit Heck, FNP   Reason for Consult: Hashimoto's Disease      HISTORY OF PRESENT ILLNESS:   here for follow up hypogonadism and hypothyroidism  found to have high prolactin   mri pituitary was normal lh fsh high testosterone low   history of low testosterone since 2022   is in a relationship and has sexual dysfunction   has liver cirrhosis stopped drinking 2021   is also diabetic well controlled   since last visit 6 m  ago  ago started on androgel 1pump daily off dostinex  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 has been off for 45m   Felt the same way with and without   came in with girlfriend watching diet lost 9 more lb   gynecomastia better and still have sexual dysfunction   hypothyroidism since 2021 has positive thyroid ab   currently on synthroid   has no family history of thyroid disease no family history of thyroid cancer and no history of irradiation to head and neck     Thyroid symptoms    Signs/Symptoms Yes No Comments   Hair/Skin/Nail Changes  x    Compressive Symptoms  x    Fatigue x     Weakness       Edema      Weight Changes  x    Menstrual Irregularities      Bowel Changes  x    Heat/Cold Intolerance  x    Palpitations  x    Tremors x  Due to neuropathy    Neck Tenderness  x    Increased Neck Size  x    Diplopia  x    Scleral Injection      Proptosis      Dry Eyes      No insomnia still has sexual issues            Review of Systems  As per H&P      Allergies  Allergies   Allergen Reactions    Adhesive Rash and Itching     Bandages         Medication History   Current Outpatient Medications   Medication Sig    albuterol sulfate (PROVENTIL  OR VENTOLIN OR PROAIR) 90 mcg/actuation Inhalation oral inhaler Take 1-2 Puffs by inhalation Every 6 hours as needed    allopurinoL (ZYLOPRIM) 100 mg Oral Tablet Take 1 Tablet (100 mg total) by mouth Once a day    amitriptyline (ELAVIL) 75 mg Oral Tablet Take 1 Tablet (75 mg total) by mouth Every night    ergocalciferol, vitamin D2, (DRISDOL) 1,250 mcg (50,000 unit) Oral Capsule Take 1 Capsule (50,000 Units total) by mouth Every 7 days    ferrous sulfate 220 mg (44 mg iron)/5 mL Oral Elixir Take 5 mL (44 mg total) by mouth    folic acid (FOLVITE) 1 mg Oral Tablet Take 1 Tablet (1 mg total) by mouth Once a day    furosemide (LASIX) 20 mg Oral Tablet Take 1 Tablet (20 mg total) by mouth  Once a day    gabapentin (NEURONTIN) 800 mg Oral Tablet Take 1 Tablet (800 mg total) by mouth    lactulose (ENULOSE) 20 gram/30 mL Oral Solution Take 15 mL by mouth Once a day    levothyroxine (SYNTHROID) 125 mcg Oral Tablet Take 1 Tablet (125 mcg total) by mouth Every morning    Nadolol (CORGARD) 20 mg Oral Tablet Take 1 Tablet (20 mg total) by mouth Once a day    omeprazole (PRILOSEC) 40 mg Oral Capsule, Delayed Release(E.C.) Take 1 Capsule (40 mg total) by mouth Once a day    pravastatin (PRAVACHOL) 20 mg Oral Tablet Take 1 Tablet (20 mg total) by mouth Every evening    rifAXIMin (XIFAXAN) 550 mg Oral Tablet Take 1 Tablet (550 mg total) by mouth Twice daily    Sildenafil (VIAGRA) 100 mg Oral Tablet Take 1 Tablet (100 mg total) by mouth Every 24 hours as needed    spironolactone (ALDACTONE) 50 mg Oral Tablet Take 1 Tablet (50 mg total) by mouth Every morning with breakfast    traMADoL (ULTRAM) 50 mg Oral Tablet Take by mouth Every 6 hours as needed for Pain        OBJECTIVE:     Vitals:    07/21/22 1501   BP: 137/75   Pulse: (!) 49   Temp: 36.5 C (97.7 F)   SpO2: 99%   Weight: 131 kg (288 lb)   Height: 1.93 m (6\' 4" )   BMI: 35.13       Physical Exam  Constitutional:       Appearance: Normal appearance. He is normal weight.   HENT:       Head: Normocephalic.   Neck:      Thyroid: No thyroid mass, thyromegaly (firm) or thyroid tenderness.   Cardiovascular:      Rate and Rhythm: Normal rate and regular rhythm.      Heart sounds: Normal heart sounds. No murmur heard.  Pulmonary:      Effort: Pulmonary effort is normal.      Breath sounds: Normal breath sounds.   Lymphadenopathy:      Cervical: No cervical adenopathy.   Neurological:      General: No focal deficit present.      Mental Status: He is alert and oriented to person, place, and time.      Cranial Nerves: No cranial nerve deficit.      Motor: No tremor.   Psychiatric:         Attention and Perception: Attention normal.         Mood and Affect: Mood normal.         Speech: Speech normal.         Behavior: Behavior normal. Behavior is cooperative.         Cognition and Memory: Cognition and memory normal.         Judgment: Judgment normal.          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 Hashimoto's thyroiditis  E06.3 THYROXINE, FREE (FREE T4)     THYROID  STIMULATING HORMONE WITH FREE T4 REFLEX      2. Hypogonadism in male  E29.1       3. Hyperprolactinemia (CMS HCC)  E22.1          Taking synthroid with iron advised avoid   Labs reviewed testosterone normal on no rx   Stay off androgel   Prolactin normal   Continue same dose of synthroid   Try Viagra 1/2pill 1/2hr before sexual event if not effective try full tab   RTC 35m with labs   Return in about 4 months (around 11/21/2022) for In Person Visit.    Andreas Blower, MD

## 2022-10-26 IMAGING — MR MRI LUMBAR SPINE WITHOUT CONTRAST
4 of 6 series · 31 of 48 positions shown · IV contrast (gadolinium)
Comparison: MRI lumbar spine dated 03/14/2021.

﻿EXAM:  [id number]   MRI LUMBAR SPINE WITHOUT CONTRAST
INDICATION: Chronic low back pain.  Lumbar radiculopathy. Bilateral hip pain.  No prior malignancy or back surgery.  History of anemia and liver disease.
TECHNIQUE: Multiplanar, multisequential MRI of the lumbosacral spine was performed without gadolinium contrast.

[Series 5: T2 · sagittal · 4.5mm · 0.94mm/px · 6 of 13 slices shown (1 of 3)]
[im 1/13]
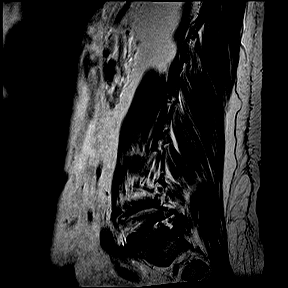
[im 3/13]
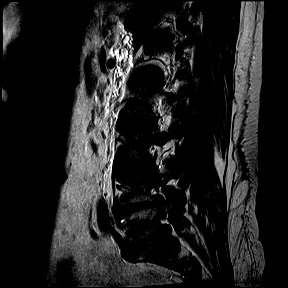
[im 5/13]
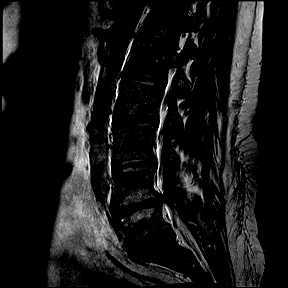
[im 8/13]
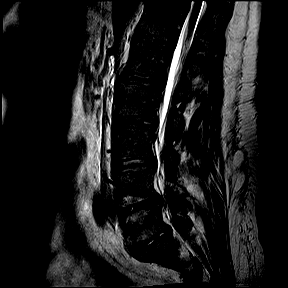
[im 10/13]
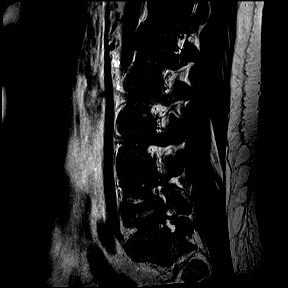
[im 13/13]
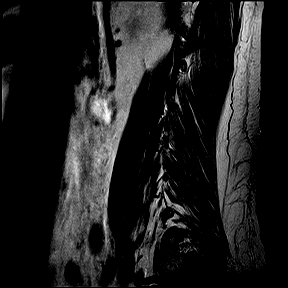

[Series 6: T1 · sagittal · 4.5mm · 0.94mm/px · 6 of 13 slices shown]
[im 1/13]
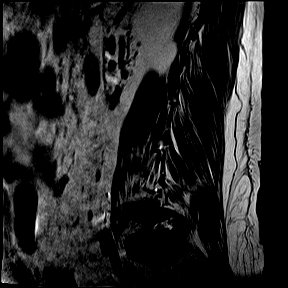
[im 3/13]
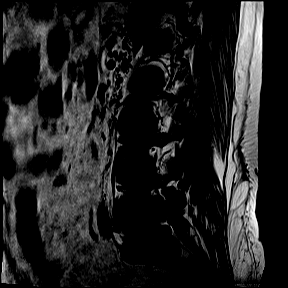
[im 5/13]
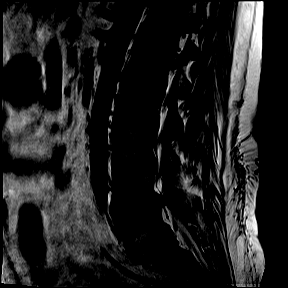
[im 8/13]
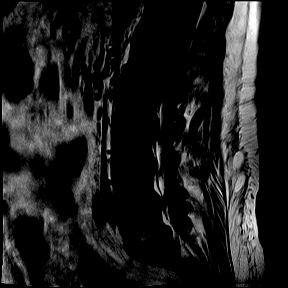
[im 10/13]
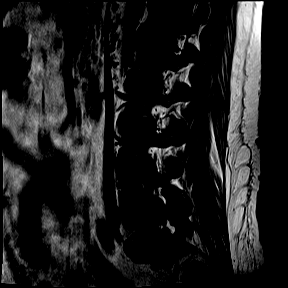
[im 13/13]
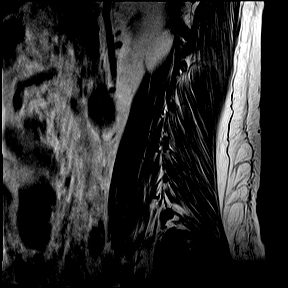

[Series 8: T2 · coronal · 5.0mm · 0.82mm/px · 8 of 18 slices shown (2 of 3)]
[im 1/18]
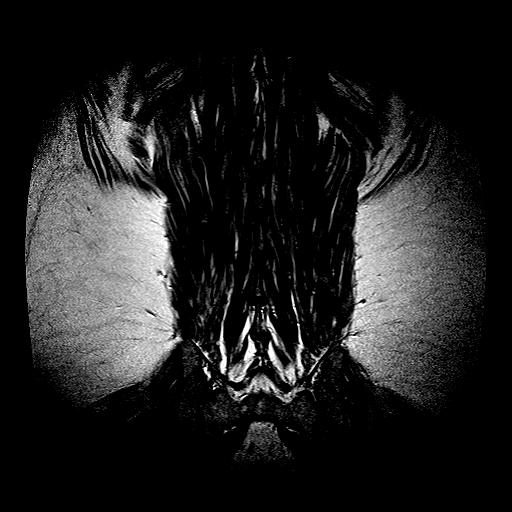
[im 3/18]
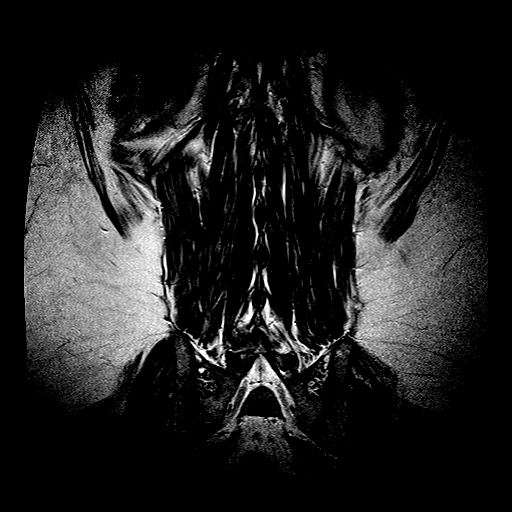
[im 5/18]
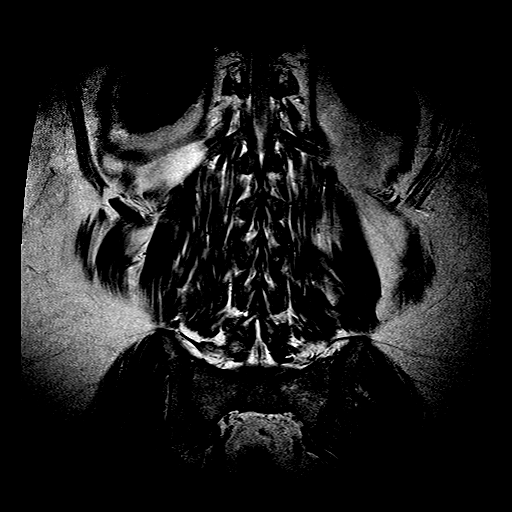
[im 8/18]
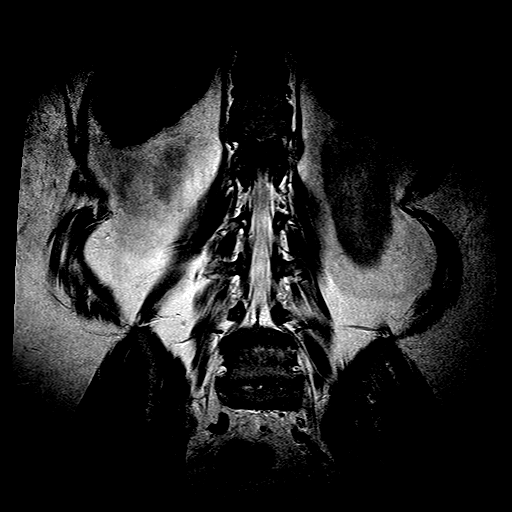
[im 10/18]
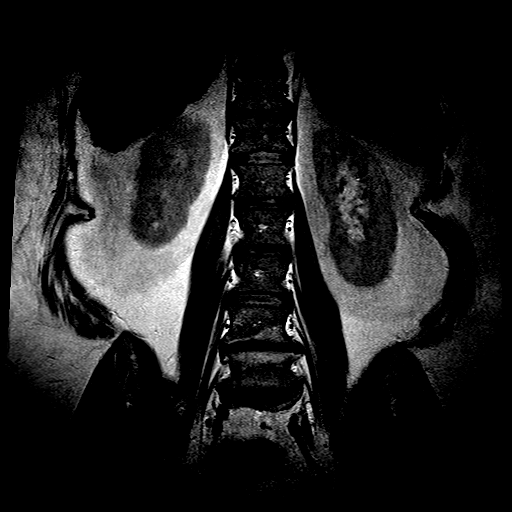
[im 13/18]
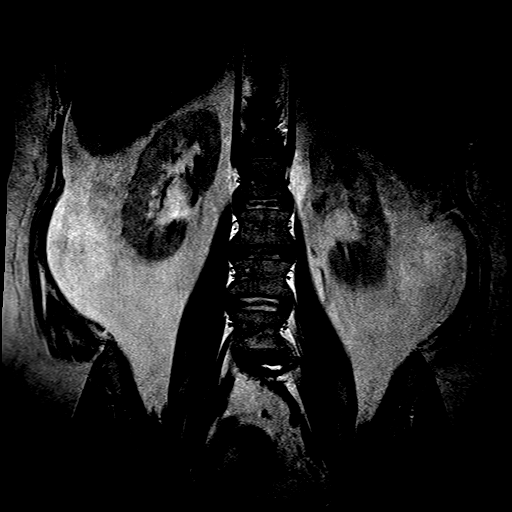
[im 15/18]
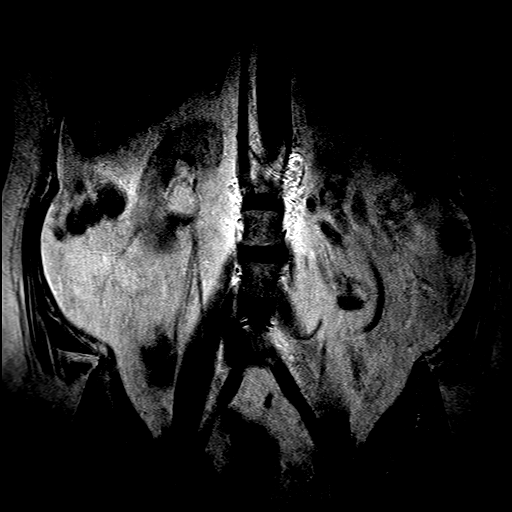
[im 18/18]
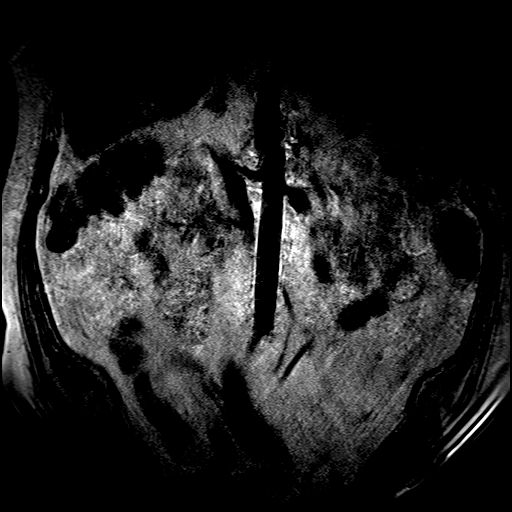

[Series 9: T2 · axial · 4.0mm · 0.52mm/px · z∈[-165,+60]mm · 11 of 23 slices shown (3 of 3)]
[im 1/23]
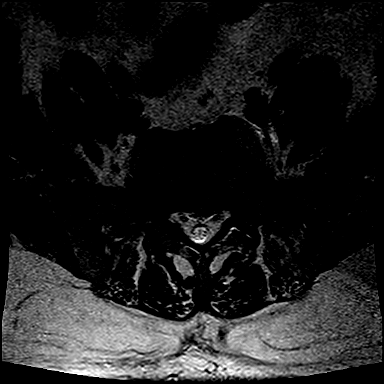
[im 3/23]
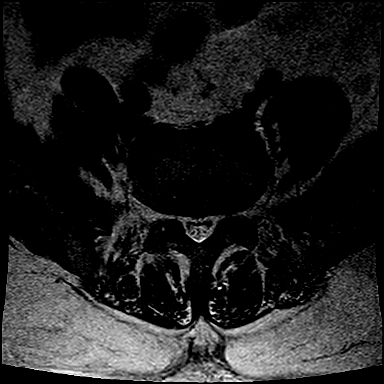
[im 5/23]
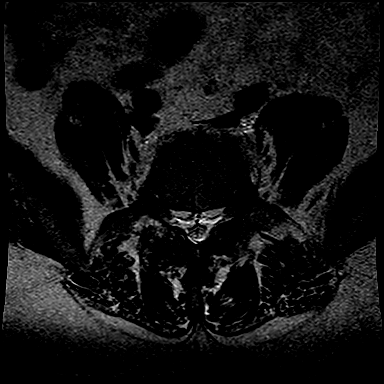
[im 7/23]
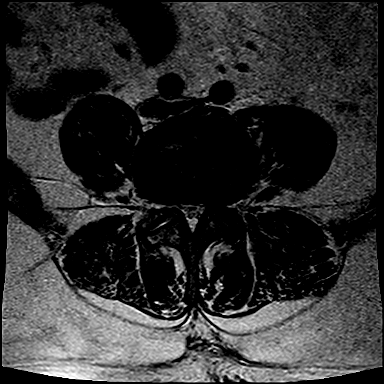
[im 9/23]
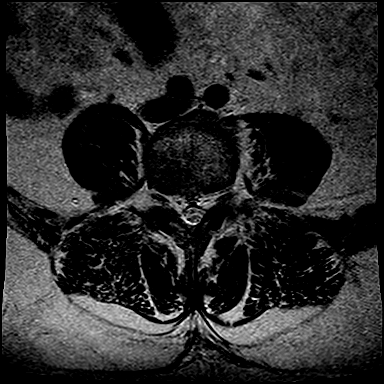
[im 12/23]
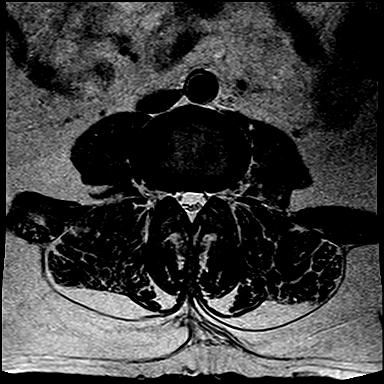
[im 14/23]
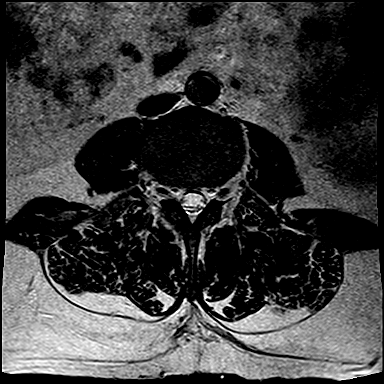
[im 16/23]
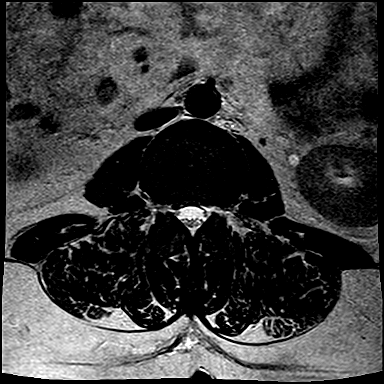
[im 18/23]
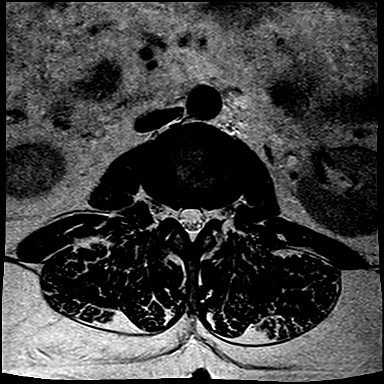
[im 20/23]
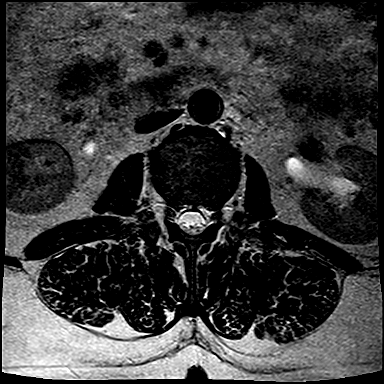
[im 23/23]
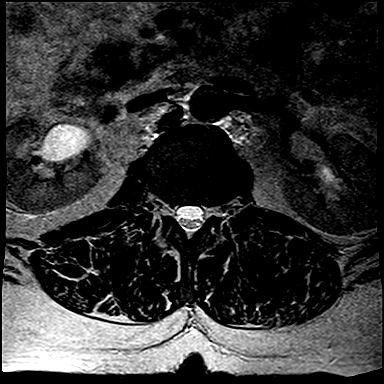

[31 of 48 positions shown; findings below may reference images not displayed]

FINDINGS: Diffusely diminished T1 signal of lumbar vertebral bone marrow is noted similar to prior examination.  Hepatomegaly and splenomegaly are suggested in the coronal images.

Conus terminates at T12-L1 level. Cauda structures are normal.

L1-2 disc is normal.  L2-3 disc shows no focal abnormalities.

At L3-4 level, mild degenerative disc changes with bulging annulus are noted causing mild compromise of lateral recess and neural foramina similar to prior study. 

At L4-5 level, degenerative disc disease with Modic inflammatory changes and bulging annulus are noted with significant facet arthropathy with hypertrophy of ligaments.  Significant compromise of lateral recess and neural foramina are noted, left worse than right and compromise of thecal sac with AP diameter in the midline measuring 7 mm similar to previous study.

At L5-S1 level, degenerative disc disease with bulging annulus causing mild biforaminal narrowing similar to prior study. 

Paravertebral soft tissues are unremarkable.
IMPRESSION: 1. Low T2 signal of lumbar vertebral bone marrow along with enlarged outline of liver and spleen suggestive of red marrow conversion due to anemia and chronic liver disease.  This finding is similar to prior study. 

2.  At L4-5 level, degenerative disc disease with Modic inflammatory changes and bulging annulus are noted with significant facet arthropathy with hypertrophy of ligaments.  Significant compromise of lateral recess and neural foramina are noted, left worse than right and compromise of thecal sac with AP diameter in the midline measuring 7 mm similar to previous study.

3. Findings at other disc levels are described above in detail.

Electronically Signed by DAOWD, YOUSSAB at [id number] [DATE]

## 2022-11-17 ENCOUNTER — Other Ambulatory Visit: Payer: Self-pay

## 2022-11-17 ENCOUNTER — Other Ambulatory Visit (HOSPITAL_BASED_OUTPATIENT_CLINIC_OR_DEPARTMENT_OTHER): Payer: Self-pay

## 2022-11-17 ENCOUNTER — Ambulatory Visit: Payer: MEDICAID | Attending: Internal Medicine | Admitting: Internal Medicine

## 2022-11-17 ENCOUNTER — Encounter (HOSPITAL_BASED_OUTPATIENT_CLINIC_OR_DEPARTMENT_OTHER): Payer: Self-pay | Admitting: Internal Medicine

## 2022-11-17 VITALS — BP 131/83 | HR 66 | Temp 97.2°F | Ht 76.0 in | Wt 295.0 lb

## 2022-11-17 DIAGNOSIS — E063 Autoimmune thyroiditis: Secondary | ICD-10-CM | POA: Insufficient documentation

## 2022-11-17 DIAGNOSIS — Z79890 Hormone replacement therapy (postmenopausal): Secondary | ICD-10-CM

## 2022-11-17 DIAGNOSIS — Z6835 Body mass index (BMI) 35.0-35.9, adult: Secondary | ICD-10-CM

## 2022-11-17 DIAGNOSIS — R37 Sexual dysfunction, unspecified: Secondary | ICD-10-CM

## 2022-11-17 DIAGNOSIS — E669 Obesity, unspecified: Secondary | ICD-10-CM

## 2022-11-17 MED ORDER — LEVOTHYROXINE 125 MCG TABLET
125.0000 ug | ORAL_TABLET | Freq: Every morning | ORAL | 5 refills | Status: DC
Start: 2022-11-17 — End: 2023-09-27

## 2022-11-17 NOTE — Patient Instructions (Signed)
patient instructed to take thyroid medication on empty stomach wait 1hr before eating  and avoid taking vitamins or iron for at least 3 hrs before and after thyroid meds

## 2022-11-17 NOTE — Progress Notes (Signed)
[facility]  [address]  [place] [address]  Operated by [facility]  Progress Note    Name: Tony Dillon MRN:  [id number]   Date: 11/17/2022 DOB:  [birth year] (46 y.o.)              ENDOCRINOLOGY OUTPATIENT PROGRESS NOTE    Name: Kejuan Geisler  Age: 47 y.o., Sex: Male  MRN: [id number]    Referred by: Gerrit Heck, FNP   Reason for Consult: Hypogonadism and Hashimoto's Disease      HISTORY OF PRESENT ILLNESS:   here for follow up hypogonadism and hypothyroidism  found to have high prolactin   mri pituitary was normal lh fsh high testosterone low   history of low testosterone since 2022   is in a relationship and has sexual dysfunction   has liver cirrhosis stopped drinking 2021   is also diabetic well controlled    started on androgel 1pump daily off dostinex  androgel 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 has been off since   Felt the same way with and without repeat testosterone was 325 prolactin normal   came in with girlfriend watching diet lost 9 more lb   gynecomastia better and still have sexual dysfunction   hypothyroidism since 2021 has positive thyroid ab   currently on synthroid   has no family history of thyroid disease no family history of thyroid cancer and no history of irradiation to head and neck   Since last visit 49m ago no hospitalization no surgery  tried Viagra did not work    Thyroid symptoms    Signs/Symptoms Yes No Comments   Hair/Skin/Nail Changes  x    Compressive Symptoms  x    Fatigue x  sometime   Weakness   x    Edema  x    Weight Changes x  Gained 7lb    Menstrual Irregularities      Bowel Changes  x    Heat/Cold Intolerance  x    Palpitations  x    Tremors x  Due to neuropathy    Neck Tenderness  x    Increased Neck Size  x    Diplopia  x    Scleral Injection      Proptosis      Dry Eyes      Has  insomnia no anxiety  still has sexual issues            Review of Systems  As per H&P      Allergies  Allergies   Allergen Reactions     Adhesive Rash and Itching     Bandages         Medication History   Current Outpatient Medications   Medication Sig    albuterol sulfate (PROVENTIL OR VENTOLIN OR PROAIR) 90 mcg/actuation Inhalation oral inhaler Take 1-2 Puffs by inhalation Every 6 hours as needed    allopurinoL (ZYLOPRIM) 100 mg Oral Tablet Take 1 Tablet (100 mg total) by mouth Once a day    amitriptyline (ELAVIL) 75 mg Oral Tablet Take 1 Tablet (75 mg total) by mouth Every night    ergocalciferol, vitamin D2, (DRISDOL) 1,250 mcg (50,000 unit) Oral Capsule Take 1 Capsule (50,000 Units total) by mouth Every 7 days    ferrous sulfate 220 mg (44 mg iron)/5 mL Oral Elixir Take 5 mL (44 mg total) by mouth    folic acid (FOLVITE) 1 mg Oral Tablet Take 1 Tablet (1 mg total)  by mouth Once a day    furosemide (LASIX) 20 mg Oral Tablet Take 1 Tablet (20 mg total) by mouth Once a day    gabapentin (NEURONTIN) 800 mg Oral Tablet Take 1 Tablet (800 mg total) by mouth Three times a day    lactulose (ENULOSE) 20 gram/30 mL Oral Solution Take 15 mL by mouth Once a day    levothyroxine (SYNTHROID) 125 mcg Oral Tablet Take 1 Tablet (125 mcg total) by mouth Every morning    Nadolol (CORGARD) 20 mg Oral Tablet Take 1 Tablet (20 mg total) by mouth Once a day    omeprazole (PRILOSEC) 40 mg Oral Capsule, Delayed Release(E.C.) Take 1 Capsule (40 mg total) by mouth Once a day    pravastatin (PRAVACHOL) 20 mg Oral Tablet Take 1 Tablet (20 mg total) by mouth Every evening    rifAXIMin (XIFAXAN) 550 mg Oral Tablet Take 1 Tablet (550 mg total) by mouth Twice daily    spironolactone (ALDACTONE) 50 mg Oral Tablet Take 1 Tablet (50 mg total) by mouth Every morning with breakfast    traMADoL (ULTRAM) 50 mg Oral Tablet Take by mouth Every 6 hours as needed for Pain        OBJECTIVE:     Vitals:    11/17/22 1458   BP: 131/83   Pulse: 66   Temp: 36.2 C (97.2 F)   SpO2: 96%   Weight: 134 kg (295 lb)   Height: 1.93 m (6\' 4" )   BMI: 35.98       Physical Exam  Constitutional:        Appearance: Normal appearance. He is normal weight.   HENT:      Head: Normocephalic.   Neck:      Thyroid: No thyroid mass, thyromegaly (firm) or thyroid tenderness.   Cardiovascular:      Rate and Rhythm: Normal rate and regular rhythm.      Heart sounds: Normal heart sounds. No murmur heard.  Pulmonary:      Effort: Pulmonary effort is normal.      Breath sounds: Normal breath sounds.   Lymphadenopathy:      Cervical: No cervical adenopathy.   Neurological:      General: No focal deficit present.      Mental Status: He is alert and oriented to person, place, and time.      Cranial Nerves: No cranial nerve deficit.      Motor: No tremor.   Psychiatric:         Attention and Perception: Attention normal.         Mood and Affect: Mood normal.         Speech: Speech normal.         Behavior: Behavior normal. Behavior is cooperative.         Cognition and Memory: Cognition and memory normal.         Judgment: Judgment normal.          Lab Investigations and Imaging  .7/10 ft4 0.75 TSH 3.09 hb 14.5 hct 43.2 testosterone 325 prolactin 11.1   11/4 ft4 0.94 TSH 2.9   ASSESSMENT & PLAN:   (E06.3) Hashimoto's thyroiditis  (primary encounter diagnosis)  Plan: THYROXINE, FREE (FREE T4), THYROID STIMULATING         HORMONE WITH FREE T4 REFLEX       ICD-10-CM    1. Hashimoto's thyroiditis  E06.3 THYROXINE, FREE (FREE T4)     THYROID STIMULATING HORMONE WITH FREE T4 REFLEX  Continue same dose of synthroid   RTC 32m with labs   Try to lose weight   Return in about 6 months (around 05/17/2023) for In Person Visit.    Andreas Blower, MD              Review of Systems

## 2022-12-07 ENCOUNTER — Other Ambulatory Visit: Payer: Self-pay

## 2022-12-07 ENCOUNTER — Encounter (INDEPENDENT_AMBULATORY_CARE_PROVIDER_SITE_OTHER): Payer: Self-pay | Admitting: NURSE PRACTITIONER

## 2022-12-07 ENCOUNTER — Ambulatory Visit: Payer: MEDICAID | Attending: NURSE PRACTITIONER | Admitting: NURSE PRACTITIONER

## 2022-12-07 VITALS — Ht 76.0 in | Wt 288.0 lb

## 2022-12-07 DIAGNOSIS — Z87891 Personal history of nicotine dependence: Secondary | ICD-10-CM

## 2022-12-07 DIAGNOSIS — R0982 Postnasal drip: Secondary | ICD-10-CM | POA: Insufficient documentation

## 2022-12-07 DIAGNOSIS — J309 Allergic rhinitis, unspecified: Secondary | ICD-10-CM

## 2022-12-07 DIAGNOSIS — J300 Vasomotor rhinitis: Secondary | ICD-10-CM | POA: Insufficient documentation

## 2022-12-07 MED ORDER — IPRATROPIUM BROMIDE 21 MCG (0.03 %) NASAL SPRAY
2.0000 | Freq: Two times a day (BID) | NASAL | 12 refills | Status: DC | PRN
Start: 2022-12-07 — End: 2023-05-10

## 2022-12-07 NOTE — Progress Notes (Signed)
[facility], [facility]  [address]  [place] [address]  Phone: [phone]  Fax: [phone]      Encounter Date: 12/07/2022    Patient ID: Tony Dillon  MRN: [id number]    DOB: 1975/12/21  Age: 47 y.o. male     Progress Note       Referring Provider:  Gerrit Heck, FNP    Reason for Visit:   Chief Complaint   Patient presents with    Sinus Problem     Pt complains of recurrent PND. Pt states this is worse when he sweats         History of Present Illness:  Tony Dillon is a 47 y.o. male referred for chronic sinusitis.  He complains of frequent PND and clear nasal drainage bilaterally.  Has tried various antihistamines      Patient History:  Patient Active Problem List   Diagnosis    Morbid obesity with BMI of 40.0-44.9, adult (CMS HCC)    Hypogonadism in male    Hyperprolactinemia (CMS HCC)    Hashimoto's thyroiditis     Current Outpatient Medications   Medication Sig    albuterol sulfate (PROVENTIL OR VENTOLIN OR PROAIR) 90 mcg/actuation Inhalation oral inhaler Take 1-2 Puffs by inhalation Every 6 hours as needed    allopurinoL (ZYLOPRIM) 100 mg Oral Tablet Take 1 Tablet (100 mg total) by mouth Once a day    amitriptyline (ELAVIL) 75 mg Oral Tablet Take 1 Tablet (75 mg total) by mouth Every night    ergocalciferol, vitamin D2, (DRISDOL) 1,250 mcg (50,000 unit) Oral Capsule Take 1 Capsule (50,000 Units total) by mouth Every 7 days    ferrous gluconate 324 mg (38 mg iron) Oral Tablet Take 1 Tablet (324 mg total) by mouth Once a day    furosemide (LASIX) 20 mg Oral Tablet Take 1 Tablet (20 mg total) by mouth Once a day    gabapentin (NEURONTIN) 800 mg Oral Tablet Take 1 Tablet (800 mg total) by mouth Three times a day    ipratropium bromide (ATROVENT) 21 mcg (0.03 %) Nasal nasal spray Administer 2 Sprays into affected nostril(s) Twice per day as needed    lactulose (ENULOSE) 20 gram/30 mL Oral Solution Take 15 mL by mouth Once a day    levothyroxine (SYNTHROID) 125 mcg Oral Tablet Take 1 Tablet (125 mcg  total) by mouth Every morning    Nadolol (CORGARD) 20 mg Oral Tablet Take 1 Tablet (20 mg total) by mouth Once a day    omeprazole (PRILOSEC) 40 mg Oral Capsule, Delayed Release(E.C.) Take 1 Capsule (40 mg total) by mouth Once a day    pravastatin (PRAVACHOL) 20 mg Oral Tablet Take 1 Tablet (20 mg total) by mouth Every evening    rifAXIMin (XIFAXAN) 550 mg Oral Tablet Take 1 Tablet (550 mg total) by mouth Twice daily    spironolactone (ALDACTONE) 25 mg Oral Tablet Take 1 Tablet (25 mg total) by mouth Once a day    traMADoL (ULTRAM) 50 mg Oral Tablet Take by mouth Every 6 hours as needed for Pain      Allergies   Allergen Reactions    Adhesive Rash and Itching     Bandages      Past Medical History:   Diagnosis Date    Cirrhosis of liver (CMS HCC)     Diabetes mellitus (CMS HCC)     Essential hypertension     Mixed hyperlipidemia      Past Surgical History:  Procedure Laterality Date    ABSCESS DRAINAGE      HX HERNIA REPAIR       Family Medical History:       Problem Relation (Age of Onset)    Diabetes Mother, Father    Elevated Lipids Father    Hypertension (High Blood Pressure) Father    Liver Cancer Father    Stroke Mother            Social History     Tobacco Use    Smoking status: Former    Smokeless tobacco: Never   Substance Use Topics    Alcohol use: Never    Drug use: Never       Review of Systems     Vitals:    12/07/22 1059   Weight: 131 kg (288 lb)   Height: 1.93 m (6\' 4" )   BMI: 35.06      ENT Physical Exam  Constitutional  Appearance: patient appears well-developed and well-groomed, obesity noted,  Communication/Voice: communication appropriate for developmental age; vocal quality normal;  Head and Face  Appearance: head appears normal, face appears normal and face appears atraumatic;  Palpation: facial palpation normal;  Salivary: glands normal;  Ear  Hearing: intact;  Auricles: right auricle normal; left auricle normal;  External Mastoids: right external mastoid normal; left external mastoid  normal;  Ear Canals: right ear canal normal; left ear canal normal;  Tympanic Membranes: right tympanic membrane normal; left tympanic membrane normal;  Nose  External Nose: nares patent bilaterally; external nose normal;  Internal Nose: nasal mucosa normal; septum normal; bilateral inferior turbinates normal;  Oral Cavity/Oropharynx  Lips: normal;  Teeth: normal;  Gums: gingiva normal;  Tongue: normal;  Oral mucosa: normal;  Hard palate: normal;  Soft palate: normal;  Tonsils: normal;  Base of Tongue: normal;  Posterior pharyngeal wall: normal;  Neck  Neck: neck normal; neck palpation normal;  Thyroid: thyroid normal;  Respiratory  Inspection: breathing unlabored; normal breathing rate;  Lymphatic  Palpation: lymph nodes normal;  Neurovestibular  Mental Status: alert and oriented;  Psychiatric: mood normal; affect is appropriate;  Cranial Nerves: cranial nerves intact;       Assessment:  ENCOUNTER DIAGNOSES     ICD-10-CM   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 Post-nasal drainage  R09.82       Plan:  Medical records reviewed on 12/07/2022.  Symptoms are suggestive for VMR. Will start Atrovent nasal spray TID as needed      Orders Placed This Encounter    62130 - NASAL ENDOSCOPY DIAGNOSTIC UNILATERAL OR BILATERAL (AMB ONLY)    ipratropium bromide (ATROVENT) 21 mcg (0.03 %) Nasal nasal spray     No follow-ups on file.    Elnora Morrison, FNP-BC  12/07/2022, 11:22

## 2022-12-23 NOTE — Procedures (Signed)
[facility]  [address]  [place] [address]  Operated by [facility]  Procedure Note    Name: Tony Dillon MRN:  [id number]   Date: 12/07/2022 DOB:  05/23/1975 (47 y.o.)         31231 - NASAL ENDOSCOPY DIAGNOSTIC UNILATERAL OR BILATERAL (AMB ONLY)    Performed by: Elnora Morrison, FNP-BC  Authorized by: Elnora Morrison, FNP-BC    Time Out:     Immediately before the procedure, a time out was called:  Yes    Patient verified:  Yes    Procedure Verified:  Yes    Site Verified:  Yes  Documentation:      [facility]  [address]  [place] [address]  Operated by [facility]  Procedure Note    Name: Tony Dillon MRN:  [id number]  Date: 12/07/2022 DOB:  Oct 19, 1975 (47 y.o.)        @PROCDOC @    Indications for procedure: nasal drainage    Anesthesia: Oxymetazoline nasal spray    Description: Nasal endoscopy with rigid scope was performed with examination of the  septum, inferior, middle, and superior meatus, turbinates, sphenoethmoidal recess, and nasopharynx.     There were no polyps, pus, or granulation tissue noted.  ET orifices and nasopharynx were normal.     Findings: normal exam    The patient tolerated the procedure well.    Elnora Morrison, FNP-BC             Elnora Morrison, FNP-BC

## 2023-01-18 ENCOUNTER — Ambulatory Visit: Payer: MEDICAID | Attending: NURSE PRACTITIONER | Admitting: NURSE PRACTITIONER

## 2023-01-18 ENCOUNTER — Other Ambulatory Visit: Payer: Self-pay

## 2023-01-18 ENCOUNTER — Encounter (INDEPENDENT_AMBULATORY_CARE_PROVIDER_SITE_OTHER): Payer: Self-pay | Admitting: NURSE PRACTITIONER

## 2023-01-18 VITALS — Ht 76.0 in | Wt 288.0 lb

## 2023-01-18 DIAGNOSIS — J300 Vasomotor rhinitis: Secondary | ICD-10-CM | POA: Insufficient documentation

## 2023-01-18 DIAGNOSIS — R0982 Postnasal drip: Secondary | ICD-10-CM | POA: Insufficient documentation

## 2023-01-18 NOTE — Progress Notes (Signed)
[facility]  [address]  [place] [address]  Phone: [phone]  Fax: [phone]      Encounter Date: 01/18/2023    Patient ID: Tony Dillon  MRN: [id number]    DOB: 1975/07/06  Age: 48 y.o. male     Progress Note       Referring Provider:  Gerrit Heck, FNP    Reason for Visit:   Chief Complaint   Patient presents with    Allergies     6 week rc VMR. Pt states Atrovent is helping. No complaints        History of Present Illness:  Tony Dillon is a 48 y.o. male follow up VMR.  Started on Atrovent nasal spray 6 weeks ago and this has relieved runny nose.      Patient History:  Patient Active Problem List   Diagnosis    Morbid obesity with BMI of 40.0-44.9, adult (CMS HCC)    Hypogonadism in male    Hyperprolactinemia (CMS HCC)    Hashimoto's thyroiditis     Current Outpatient Medications   Medication Sig    albuterol sulfate (PROVENTIL OR VENTOLIN OR PROAIR) 90 mcg/actuation Inhalation oral inhaler Take 1-2 Puffs by inhalation Every 6 hours as needed    allopurinoL (ZYLOPRIM) 100 mg Oral Tablet Take 1 Tablet (100 mg total) by mouth Once a day    amitriptyline (ELAVIL) 75 mg Oral Tablet Take 1 Tablet (75 mg total) by mouth Every night    ergocalciferol, vitamin D2, (DRISDOL) 1,250 mcg (50,000 unit) Oral Capsule Take 1 Capsule (50,000 Units total) by mouth Every 7 days    ferrous gluconate 324 mg (38 mg iron) Oral Tablet Take 1 Tablet (324 mg total) by mouth Once a day    folic acid (FOLVITE) 1 mg Oral Tablet Take 1 Tablet (1 mg total) by mouth Every morning    furosemide (LASIX) 20 mg Oral Tablet Take 1 Tablet (20 mg total) by mouth Once a day    gabapentin (NEURONTIN) 800 mg Oral Tablet Take 1 Tablet (800 mg total) by mouth Three times a day    ipratropium bromide (ATROVENT) 21 mcg (0.03 %) Nasal nasal spray Administer 2 Sprays into affected nostril(s) Twice per day as needed    lactulose (ENULOSE) 20 gram/30 mL Oral Solution Take 15 mL by mouth Once a day    levothyroxine (SYNTHROID) 125 mcg  Oral Tablet Take 1 Tablet (125 mcg total) by mouth Every morning    Nadolol (CORGARD) 20 mg Oral Tablet Take 1 Tablet (20 mg total) by mouth Once a day    omeprazole (PRILOSEC) 40 mg Oral Capsule, Delayed Release(E.C.) Take 1 Capsule (40 mg total) by mouth Once a day    pravastatin (PRAVACHOL) 20 mg Oral Tablet Take 1 Tablet (20 mg total) by mouth Every evening    rifAXIMin (XIFAXAN) 550 mg Oral Tablet Take 1 Tablet (550 mg total) by mouth Twice daily    spironolactone (ALDACTONE) 25 mg Oral Tablet Take 1 Tablet (25 mg total) by mouth Once a day    traMADoL (ULTRAM) 50 mg Oral Tablet Take by mouth Every 6 hours as needed for Pain      Allergies   Allergen Reactions    Adhesive Rash and Itching     Bandages      Past Medical History:   Diagnosis Date    Cirrhosis of liver (CMS HCC)     Diabetes mellitus (CMS HCC)     Essential hypertension  Mixed hyperlipidemia      Past Surgical History:   Procedure Laterality Date    ABSCESS DRAINAGE      HX HERNIA REPAIR       Family Medical History:       Problem Relation (Age of Onset)    Diabetes Mother, Father    Elevated Lipids Father    Hypertension (High Blood Pressure) Father    Liver Cancer Father    Stroke Mother            Social History     Tobacco Use    Smoking status: Former    Smokeless tobacco: Never   Substance Use Topics    Alcohol use: Never    Drug use: Never       Review of Systems     Vitals:    01/18/23 1449   Weight: 131 kg (288 lb)   Height: 1.93 m (6\' 4" )   BMI: 35.06      ENT Physical Exam  Constitutional  Appearance: patient appears well-developed and well-groomed, obesity noted,  Communication/Voice: communication appropriate for developmental age; vocal quality normal;  Head and Face  Appearance: head appears normal, face appears normal and face appears atraumatic;  Palpation: facial palpation normal;  Salivary: glands normal;  Ear  Hearing: intact;  Auricles: right auricle normal; left auricle normal;  External Mastoids: right external mastoid  normal; left external mastoid normal;  Ear Canals: right ear canal normal; left ear canal normal;  Tympanic Membranes: right tympanic membrane normal; left tympanic membrane normal;  Nose  External Nose: nares patent bilaterally; external nose normal;  Internal Nose: nasal mucosa normal; septum normal; bilateral inferior turbinates normal;  Oral Cavity/Oropharynx  Lips: normal;  Teeth: normal;  Gums: gingiva normal;  Tongue: normal;  Oral mucosa: normal;  Hard palate: normal;  Soft palate: normal;  Tonsils: normal;  Base of Tongue: normal;  Posterior pharyngeal wall: normal;  Neck  Neck: neck normal; neck palpation normal;  Thyroid: thyroid normal;  Respiratory  Inspection: breathing unlabored; normal breathing rate;  Lymphatic  Palpation: lymph nodes normal;  Neurovestibular  Mental Status: alert and oriented;  Psychiatric: mood normal; affect is appropriate;  Cranial Nerves: cranial nerves intact;       Assessment:  ENCOUNTER DIAGNOSES     ICD-10-CM   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 Post-nasal drainage  R09.82       Plan:  Medical records reviewed on 01/18/2023.  Continue Atrovent nasal spray BID to TID as needed  No orders of the defined types were placed in this encounter.    Return in about 6 months (around 07/18/2023).    Elnora Morrison, FNP-BC  01/18/2023, 15:04

## 2023-05-09 ENCOUNTER — Telehealth (INDEPENDENT_AMBULATORY_CARE_PROVIDER_SITE_OTHER): Payer: Self-pay | Admitting: NURSE PRACTITIONER

## 2023-05-09 NOTE — Telephone Encounter (Signed)
 Patient states he was told to call back for his Atrovent  to be increased. Please advise

## 2023-05-10 MED ORDER — IPRATROPIUM BROMIDE 42 MCG (0.06 %) NASAL SPRAY
2.0000 | Freq: Three times a day (TID) | NASAL | 5 refills | Status: DC
Start: 2023-05-10 — End: 2023-07-19

## 2023-05-19 ENCOUNTER — Ambulatory Visit (HOSPITAL_BASED_OUTPATIENT_CLINIC_OR_DEPARTMENT_OTHER): Payer: Self-pay | Admitting: Internal Medicine

## 2023-07-12 ENCOUNTER — Ambulatory Visit (HOSPITAL_BASED_OUTPATIENT_CLINIC_OR_DEPARTMENT_OTHER): Payer: Self-pay | Admitting: Internal Medicine

## 2023-07-19 ENCOUNTER — Ambulatory Visit: Payer: MEDICAID | Attending: NURSE PRACTITIONER | Admitting: NURSE PRACTITIONER

## 2023-07-19 ENCOUNTER — Other Ambulatory Visit: Payer: Self-pay

## 2023-07-19 ENCOUNTER — Encounter (INDEPENDENT_AMBULATORY_CARE_PROVIDER_SITE_OTHER): Payer: Self-pay | Admitting: NURSE PRACTITIONER

## 2023-07-19 VITALS — Ht 76.0 in | Wt 286.0 lb

## 2023-07-19 DIAGNOSIS — R0982 Postnasal drip: Secondary | ICD-10-CM | POA: Insufficient documentation

## 2023-07-19 DIAGNOSIS — J300 Vasomotor rhinitis: Secondary | ICD-10-CM | POA: Insufficient documentation

## 2023-07-19 MED ORDER — IPRATROPIUM BROMIDE 42 MCG (0.06 %) NASAL SPRAY
2.0000 | Freq: Three times a day (TID) | NASAL | 11 refills | Status: AC
Start: 2023-07-19 — End: ?

## 2023-07-19 NOTE — Progress Notes (Signed)
 ENT, [facility]  [address]  [place] [address]  Phone: [phone]  Fax: [phone]      Encounter Date: 07/19/2023    Patient ID: Tony Dillon  MRN: [id number]    DOB: [birth year]  Age: 48 y.o. male     Progress Note       Referring Provider:  Cleotilde Lukes, FNP    Reason for Visit:   Chief Complaint   Patient presents with    Allergies     6 mo rc on VMR. No new complaints. Atrovent  helping        History of Present Illness:  Tony Dillon is a 48 y.o. male follow up VMR.  Using Atrovent  nasal spray      Patient History:  Problem List[1]  Current Outpatient Medications   Medication Sig    albuterol sulfate (PROVENTIL OR VENTOLIN OR PROAIR) 90 mcg/actuation Inhalation oral inhaler Take 1-2 Puffs by inhalation Every 6 hours as needed    allopurinoL (ZYLOPRIM) 100 mg Oral Tablet Take 1 Tablet (100 mg total) by mouth Once a day    amitriptyline (ELAVIL) 75 mg Oral Tablet Take 1 Tablet (75 mg total) by mouth Every night    ergocalciferol, vitamin D2, (DRISDOL) 1,250 mcg (50,000 unit) Oral Capsule Take 1 Capsule (50,000 Units total) by mouth Every 7 days    ferrous gluconate 324 mg (38 mg iron) Oral Tablet Take 1 Tablet (324 mg total) by mouth Once a day    folic acid (FOLVITE) 1 mg Oral Tablet Take 1 Tablet (1 mg total) by mouth Every morning    furosemide (LASIX) 20 mg Oral Tablet Take 1 Tablet (20 mg total) by mouth Once a day    gabapentin (NEURONTIN) 800 mg Oral Tablet Take 1 Tablet (800 mg total) by mouth Three times a day    ipratropium bromide  (ATROVENT ) 42 mcg (0.06 %) Nasal Spray, Non-Aerosol Administer 2 Sprays into affected nostril(s) Three times a day    lactulose (ENULOSE) 10 gram/15 mL Oral Solution Take 15 mL by mouth Daily    levothyroxine  (SYNTHROID) 125 mcg Oral Tablet Take 1 Tablet (125 mcg total) by mouth Every morning    Nadolol (CORGARD) 20 mg Oral Tablet Take 1 Tablet (20 mg total) by mouth Once a day    omeprazole (PRILOSEC) 40 mg Oral Capsule, Delayed Release(E.C.) Take 1  Capsule (40 mg total) by mouth Once a day    pravastatin (PRAVACHOL) 20 mg Oral Tablet Take 1 Tablet (20 mg total) by mouth Every evening    rifAXIMin (XIFAXAN) 550 mg Oral Tablet Take 1 Tablet (550 mg total) by mouth Twice daily    spironolactone (ALDACTONE) 25 mg Oral Tablet Take 1 Tablet (25 mg total) by mouth Once a day    traMADoL (ULTRAM) 50 mg Oral Tablet Take by mouth Every 6 hours as needed for Pain      Allergies[2]  Past Medical History:   Diagnosis Date    Cirrhosis of liver     Diabetes mellitus     Essential hypertension     Mixed hyperlipidemia      Past Surgical History:   Procedure Laterality Date    ABSCESS DRAINAGE      HX HERNIA REPAIR       Family Medical History:       Problem Relation (Age of Onset)    Diabetes Mother, Father    Elevated Lipids Father    Hypertension (High Blood Pressure) Father    Liver Cancer  Father    Stroke Mother            Social History[3]    Review of Systems     Vitals:    07/19/23 1330   Weight: 130 kg (286 lb)   Height: 1.93 m (6' 4)   BMI: 34.81      ENT Physical Exam  Constitutional  Appearance: patient appears well-developed and well-groomed, obesity noted,  Communication/Voice: communication appropriate for developmental age; vocal quality normal;  Head and Face  Appearance: head appears normal, face appears normal and face appears atraumatic;  Palpation: facial palpation normal;  Salivary: glands normal;  Ear  Hearing: intact;  Auricles: right auricle normal; left auricle normal;  External Mastoids: right external mastoid normal; left external mastoid normal;  Ear Canals: right ear canal normal; left ear canal normal;  Tympanic Membranes: right tympanic membrane normal; left tympanic membrane normal;  Nose  External Nose: nares patent bilaterally; external nose normal;  Internal Nose: nasal mucosa normal; septum normal; bilateral inferior turbinates normal;  Oral Cavity/Oropharynx  Lips: normal;  Teeth: normal;  Gums: gingiva normal;  Tongue: normal;  Oral  mucosa: normal;  Hard palate: normal;  Soft palate: normal;  Tonsils: normal;  Base of Tongue: normal;  Posterior pharyngeal wall: normal;  Neck  Neck: neck normal; neck palpation normal;  Thyroid : thyroid  normal;  Respiratory  Inspection: breathing unlabored; normal breathing rate;  Lymphatic  Palpation: lymph nodes normal;  Neurovestibular  Mental Status: alert and oriented;  Psychiatric: mood normal; affect is appropriate;  Cranial Nerves: cranial nerves intact;       Assessment:  ENCOUNTER DIAGNOSES     ICD-10-CM   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 Post-nasal drainage  R09.82       Plan:  Medical records reviewed on 07/19/2023.  PND improved and VMR controlled. Will continue Atrovent  spray       Orders Placed This Encounter    ipratropium bromide  (ATROVENT ) 42 mcg (0.06 %) Nasal Spray, Non-Aerosol     Return in about 1 year (around 07/18/2024).    Eleanor Blazer, FNP-BC  07/19/2023, 12:48          [1]   Patient Active Problem List  Diagnosis    Morbid obesity with BMI of 40.0-44.9, adult (CMS HCC)    Hypogonadism in male    Hyperprolactinemia (CMS HCC)    Hashimoto's thyroiditis   [2]   Allergies  Allergen Reactions    Adhesive Rash and Itching     Bandages    [3]   Social History  Tobacco Use    Smoking status: Former    Smokeless tobacco: Never   Substance Use Topics    Alcohol use: Never    Drug use: Never

## 2023-09-27 ENCOUNTER — Ambulatory Visit: Payer: MEDICAID | Attending: Internal Medicine | Admitting: Internal Medicine

## 2023-09-27 ENCOUNTER — Other Ambulatory Visit: Payer: Self-pay

## 2023-09-27 ENCOUNTER — Encounter (HOSPITAL_BASED_OUTPATIENT_CLINIC_OR_DEPARTMENT_OTHER): Payer: Self-pay | Admitting: Internal Medicine

## 2023-09-27 ENCOUNTER — Other Ambulatory Visit (HOSPITAL_BASED_OUTPATIENT_CLINIC_OR_DEPARTMENT_OTHER): Payer: Self-pay

## 2023-09-27 VITALS — BP 152/100 | HR 80 | Temp 98.2°F | Ht 76.0 in | Wt 291.0 lb

## 2023-09-27 DIAGNOSIS — E063 Autoimmune thyroiditis: Secondary | ICD-10-CM | POA: Insufficient documentation

## 2023-09-27 MED ORDER — LEVOTHYROXINE 125 MCG TABLET
125.0000 ug | ORAL_TABLET | Freq: Every morning | ORAL | 3 refills | Status: AC
Start: 2023-09-27 — End: ?

## 2023-09-27 NOTE — Progress Notes (Signed)
 END[facility]Y, [facility]  [address]  [place] [address]  Operated by [facility]  Progress Note    Name: Tony Dillon MRN:  [id number]   Date: 09/27/2023 DOB:  Feb 02, 1975 (48 y.o.)              ENDOCRINOLOGY OUTPATIENT PROGRESS NOTE    Name: Tony Dillon  Age: 48 y.o., Sex: Male  MRN: [id number]    Referred by: Lucie Pinal, FNP   Reason for Consult: Hashimoto's Disease      HISTORY OF PRESENT ILLNESS:   here for follow up hypogonadism and hypothyroidism  found to have high prolactin   mri pituitary was normal lh fsh high testosterone  low   history of low testosterone  since 2022   is in a relationship and has sexual dysfunction   has liver cirrhosis stopped drinking 2021   is also diabetic well controlled    started on androgel  1pump daily off dostinex  androgel  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 has been off since   Felt the same way with and without repeat testosterone  was 325 prolactin normal   gynecomastia better and still have sexual dysfunction   hypothyroidism since 2021 has positive thyroid  ab   currently on synthroid 125mcg taking regular has not missed   has no family history of thyroid  disease no family history of thyroid  cancer and no history of irradiation to head and neck   Since last visit 10 m ago no hospitalization no surgery    Has a new girlfriend no sexual issues   Thyroid  symptoms    Signs/Symptoms Yes No Comments   Hair/Skin/Nail Changes  x    Compressive Symptoms  x    Fatigue x  sometime   Weakness   x    Edema  x    Weight Changes  x    Menstrual Irregularities      Bowel Changes  x    Heat/Cold Intolerance  x    Palpitations  x    Tremors x  Due to neuropathy    Neck Tenderness  x    Increased Neck Size  x    Diplopia  x    Scleral Injection      Proptosis      Dry Eyes      Has  insomnia no anxiety   has no sexual issues            Review of Systems  As per H&P      Allergies  Allergies   Allergen Reactions    Adhesive Rash and Itching     Bandages          Medication History   Current Outpatient Medications   Medication Sig    albuterol sulfate (PROVENTIL OR VENTOLIN OR PROAIR) 90 mcg/actuation Inhalation oral inhaler Take 1-2 Puffs by inhalation Every 6 hours as needed    allopurinoL (ZYLOPRIM) 100 mg Oral Tablet Take 1 Tablet (100 mg total) by mouth Once a day    amitriptyline (ELAVIL) 75 mg Oral Tablet Take 1 Tablet (75 mg total) by mouth Every night    ergocalciferol, vitamin D2, (DRISDOL) 1,250 mcg (50,000 unit) Oral Capsule Take 1 Capsule (50,000 Units total) by mouth Every 7 days    ferrous gluconate 324 mg (38 mg iron) Oral Tablet Take 1 Tablet (324 mg total) by mouth Once a day    folic acid (FOLVITE) 1 mg Oral Tablet Take 1 Tablet (1 mg total) by mouth Every morning  furosemide (LASIX) 20 mg Oral Tablet Take 1 Tablet (20 mg total) by mouth Once a day    gabapentin (NEURONTIN) 800 mg Oral Tablet Take 1 Tablet (800 mg total) by mouth Three times a day    ipratropium bromide  (ATROVENT ) 42 mcg (0.06 %) Nasal Spray, Non-Aerosol Administer 2 Sprays into affected nostril(s) Three times a day    lactulose (ENULOSE) 10 gram/15 mL Oral Solution Take 15 mL by mouth Daily    levothyroxine  (SYNTHROID) 125 mcg Oral Tablet Take 1 Tablet (125 mcg total) by mouth Every morning    Nadolol (CORGARD) 20 mg Oral Tablet Take 1 Tablet (20 mg total) by mouth Once a day    omeprazole (PRILOSEC) 40 mg Oral Capsule, Delayed Release(E.C.) Take 1 Capsule (40 mg total) by mouth Once a day    pravastatin (PRAVACHOL) 20 mg Oral Tablet Take 1 Tablet (20 mg total) by mouth Every evening    rifAXIMin (XIFAXAN) 550 mg Oral Tablet Take 1 Tablet (550 mg total) by mouth Twice daily    spironolactone (ALDACTONE) 25 mg Oral Tablet Take 1 Tablet (25 mg total) by mouth Once a day    traMADoL (ULTRAM) 50 mg Oral Tablet Take by mouth Every 6 hours as needed for Pain        OBJECTIVE:     Vitals:    09/27/23 1528   BP: (!) 152/100   Pulse: 80   Temp: 36.8 C (98.2 F)   SpO2: 97%   Weight: 132  kg (291 lb)   Height: 1.93 m (6' 4)   BMI: 35.42       Physical Exam  Constitutional:       Appearance: Normal appearance. He is normal weight.   HENT:      Head: Normocephalic.   Neck:      Thyroid : No thyroid  mass, thyromegaly (firm) or thyroid  tenderness.   Cardiovascular:      Rate and Rhythm: Normal rate and regular rhythm.      Heart sounds: Normal heart sounds. No murmur heard.  Pulmonary:      Effort: Pulmonary effort is normal.      Breath sounds: Normal breath sounds.   Lymphadenopathy:      Cervical: No cervical adenopathy.   Neurological:      General: No focal deficit present.      Mental Status: He is alert and oriented to person, place, and time.      Cranial Nerves: No cranial nerve deficit.      Motor: No tremor.   Psychiatric:         Attention and Perception: Attention normal.         Mood and Affect: Mood normal.         Speech: Speech normal.         Behavior: Behavior normal. Behavior is cooperative.         Cognition and Memory: Cognition and memory normal.         Judgment: Judgment normal.          Lab Investigations and Imaging  .7/10 ft4 0.75 TSH 3.09 hb 14.5 hct 43.2 testosterone  325 prolactin 11.1   11/4 ft4 0.94 TSH 2.9   09/20/2023 ft4 1.14 TSH 0.628   ASSESSMENT & PLAN:   (E06.3) Hashimoto's thyroiditis  (primary encounter diagnosis)         ICD-10-CM    1. Hashimoto's thyroiditis  E06.3            Continue same dose  of synthroid   Can be followed by pmd       Return in about 1 year (around 09/26/2024) for In Person Visit.    Erick Isles, MD

## 2023-09-27 NOTE — Patient Instructions (Signed)
 patient instructed to take thyroid medication on empty stomach wait 1hr before eating  and avoid taking vitamins or iron for at least 3 hrs before and after thyroid meds

## 2024-07-24 ENCOUNTER — Ambulatory Visit (INDEPENDENT_AMBULATORY_CARE_PROVIDER_SITE_OTHER): Payer: Self-pay | Admitting: NURSE PRACTITIONER
